# Patient Record
Sex: Female | Born: 1995 | Race: White | Hispanic: No | Marital: Single | Smoking: Never smoker
Health system: Southern US, Community
[De-identification: ages and names within clinical notes are randomized; demographics above are authoritative.]

## PROBLEM LIST (undated history)

## (undated) DIAGNOSIS — J45909 Unspecified asthma, uncomplicated: Secondary | ICD-10-CM

## (undated) DIAGNOSIS — E7800 Pure hypercholesterolemia, unspecified: Secondary | ICD-10-CM

## (undated) HISTORY — PX: WRIST SURGERY: SHX841

---

## 2016-01-14 ENCOUNTER — Inpatient Hospital Stay
Admission: EM | Admit: 2016-01-14 | Discharge: 2016-01-16 | DRG: 871 | Disposition: A | Discharge status: Home / Self Care | Payer: Managed Care, Other (non HMO) | Attending: Internal Medicine | Admitting: Internal Medicine

## 2016-01-14 ENCOUNTER — Encounter: Payer: Self-pay | Admitting: Emergency Medicine

## 2016-01-14 ENCOUNTER — Emergency Department: Payer: Managed Care, Other (non HMO)

## 2016-01-14 DIAGNOSIS — R000 Tachycardia, unspecified: Secondary | ICD-10-CM | POA: Diagnosis not present

## 2016-01-14 DIAGNOSIS — Z23 Encounter for immunization: Secondary | ICD-10-CM

## 2016-01-14 DIAGNOSIS — R509 Fever, unspecified: Secondary | ICD-10-CM

## 2016-01-14 DIAGNOSIS — A419 Sepsis, unspecified organism: Principal | ICD-10-CM | POA: Diagnosis present

## 2016-01-14 DIAGNOSIS — J189 Pneumonia, unspecified organism: Secondary | ICD-10-CM | POA: Diagnosis present

## 2016-01-14 DIAGNOSIS — J45909 Unspecified asthma, uncomplicated: Secondary | ICD-10-CM | POA: Diagnosis present

## 2016-01-14 DIAGNOSIS — Z79899 Other long term (current) drug therapy: Secondary | ICD-10-CM

## 2016-01-14 DIAGNOSIS — E7800 Pure hypercholesterolemia, unspecified: Secondary | ICD-10-CM | POA: Diagnosis present

## 2016-01-14 HISTORY — DX: Pure hypercholesterolemia, unspecified: E78.00

## 2016-01-14 HISTORY — DX: Unspecified asthma, uncomplicated: J45.909

## 2016-01-14 LAB — LACTIC ACID, PLASMA: Lactic Acid, Venous: 1.4 mmol/L (ref 0.5–1.9)

## 2016-01-14 LAB — CBC WITH DIFFERENTIAL/PLATELET
BASOS PCT: 0 %
Basophils Absolute: 0 10*3/uL (ref 0–0.1)
Eosinophils Absolute: 0 10*3/uL (ref 0–0.7)
Eosinophils Relative: 0 %
HEMATOCRIT: 41.5 % (ref 35.0–47.0)
Hemoglobin: 14 g/dL (ref 12.0–16.0)
Lymphocytes Relative: 7 %
Lymphs Abs: 0.9 10*3/uL — ABNORMAL LOW (ref 1.0–3.6)
MCH: 29 pg (ref 26.0–34.0)
MCHC: 33.6 g/dL (ref 32.0–36.0)
MCV: 86.4 fL (ref 80.0–100.0)
MONO ABS: 0.4 10*3/uL (ref 0.2–0.9)
MONOS PCT: 3 %
NEUTROS ABS: 11.3 10*3/uL — AB (ref 1.4–6.5)
Neutrophils Relative %: 90 %
Platelets: 174 10*3/uL (ref 150–440)
RBC: 4.81 MIL/uL (ref 3.80–5.20)
RDW: 13.3 % (ref 11.5–14.5)
WBC: 12.5 10*3/uL — ABNORMAL HIGH (ref 3.6–11.0)

## 2016-01-14 LAB — URINALYSIS COMPLETE WITH MICROSCOPIC (ARMC ONLY)
BILIRUBIN URINE: NEGATIVE
Bacteria, UA: NONE SEEN
Glucose, UA: NEGATIVE mg/dL
HGB URINE DIPSTICK: NEGATIVE
KETONES UR: NEGATIVE mg/dL
LEUKOCYTES UA: NEGATIVE
Nitrite: NEGATIVE
PH: 7 (ref 5.0–8.0)
Protein, ur: 100 mg/dL — AB
Specific Gravity, Urine: 1.025 (ref 1.005–1.030)

## 2016-01-14 LAB — COMPREHENSIVE METABOLIC PANEL
ALBUMIN: 4.2 g/dL (ref 3.5–5.0)
ALT: 18 U/L (ref 14–54)
ANION GAP: 9 (ref 5–15)
AST: 23 U/L (ref 15–41)
Alkaline Phosphatase: 50 U/L (ref 38–126)
BUN: 8 mg/dL (ref 6–20)
CO2: 23 mmol/L (ref 22–32)
Calcium: 9.1 mg/dL (ref 8.9–10.3)
Chloride: 102 mmol/L (ref 101–111)
Creatinine, Ser: 0.89 mg/dL (ref 0.44–1.00)
Glucose, Bld: 124 mg/dL — ABNORMAL HIGH (ref 65–99)
POTASSIUM: 3.7 mmol/L (ref 3.5–5.1)
Sodium: 134 mmol/L — ABNORMAL LOW (ref 135–145)
TOTAL PROTEIN: 8.5 g/dL — AB (ref 6.5–8.1)

## 2016-01-14 LAB — LIPASE, BLOOD: Lipase: 23 U/L (ref 11–51)

## 2016-01-14 LAB — POCT PREGNANCY, URINE: PREG TEST UR: NEGATIVE

## 2016-01-14 MED ORDER — SODIUM CHLORIDE 0.9 % IV BOLUS (SEPSIS)
1000.0000 mL | Freq: Once | INTRAVENOUS | Status: AC
  Administered 2016-01-14: 1000 mL via INTRAVENOUS

## 2016-01-14 MED ORDER — LEVOFLOXACIN IN D5W 750 MG/150ML IV SOLN
750.0000 mg | Freq: Once | INTRAVENOUS | Status: AC
  Administered 2016-01-14: 750 mg via INTRAVENOUS
  Filled 2016-01-14: qty 150

## 2016-01-14 MED ORDER — LEVOFLOXACIN 750 MG PO TABS: 750.0000 mg | ORAL_TABLET | Freq: Every day | ORAL | 0 refills | Status: DC

## 2016-01-14 NOTE — ED Notes (Signed)
Pt reports taking Amoxicillin 500mg  BID since 30 Oct for a sinus infection, pt reports HA,  feverish, sore throat, nasal drips, chills and sweats for the last 24 hours.  Reports 4 x 325mg  tylenol in the last 24 and multiple Excedrin extra strength.

## 2016-01-14 NOTE — Discharge Instructions (Signed)
Please seek medical attention for any high fevers, chest pain, shortness of breath, change in behavior, persistent vomiting, bloody stool or any other new or concerning symptoms.  

## 2016-01-14 NOTE — ED Triage Notes (Signed)
Patient with complaint of cough, congestion, fever and right upper abd pain that started last Thursday. Patient was seen at urgent care and started on antibiotics on Saturday. Patient reports that she stared feeling better. Then Friday morning started running a fever.

## 2016-01-14 NOTE — ED Provider Notes (Signed)
[facility] Emergency Department Provider Note    ____________________________________________   I have reviewed the triage vital signs and the nursing notes.   HISTORY  Chief Complaint Fever; Headache; Abdominal Pain; and Nasal Congestion   History limited by: Not Limited   HPI Robin Riddle is a 20 y.o. female who presents to the emergency department today because of concerns for headache, fever, cough and congestion. The patient states that she had symptoms of headache, congestion for roughly 1 week. She has a history of recurrent sinus infections so did Prescribe some penicillin. Yesterday the patient developed a fever. Temp max 104. The patient additionally started having pain in her right lower chest when she took deep breaths or cough. Her cough is productive of yellow phlegm.   Past Medical History:  Diagnosis Date  . Asthma   . Hypercholesterolemia     There are no active problems to display for this patient.   Past Surgical History:  Procedure Laterality Date  . WRIST SURGERY      Prior to Admission medications   Not on File    Allergies Review of patient's allergies indicates no known allergies.  No family history on file.  Social History Social History  Substance Use Topics  . Smoking status: Never Smoker  . Smokeless tobacco: Never Used  . Alcohol use Yes     Comment: occ    Review of Systems  Constitutional: Positive for fever. Cardiovascular: Positive for right lower chest pain. Respiratory: Negative for shortness of breath. Positive for cough. Gastrointestinal: Negative for abdominal pain, vomiting and diarrhea. Neurological: Negative for headaches, focal weakness or numbness.  10-point ROS otherwise negative.  ____________________________________________   PHYSICAL EXAM:  VITAL SIGNS: ED Triage Vitals  Enc Vitals Group     BP 01/14/16 1915 (!) 133/91     Pulse Rate 01/14/16 1915 (!) 146     Resp  01/14/16 1915 20     Temp 01/14/16 1915 (!) 103.2 F (39.6 C)     Temp Source 01/14/16 1915 Oral     SpO2 01/14/16 1915 96 %     Weight 01/14/16 1916 158 lb (71.7 kg)     Height 01/14/16 1916 5\' 9"  (1.753 m)     Head Circumference --      Peak Flow --      Pain Score 01/14/16 1925 7   Constitutional: Alert and oriented. Well appearing and in no distress. Eyes: Conjunctivae are normal. Normal extraocular movements. ENT   Head: Normocephalic and atraumatic.   Nose: No congestion/rhinnorhea.   Mouth/Throat: Mucous membranes are moist.   Neck: No stridor. Hematological/Lymphatic/Immunilogical: No cervical lymphadenopathy. Cardiovascular: Tachycardic, regular rhythm.  No murmurs, rubs, or gallops. Respiratory: Normal respiratory effort without tachypnea nor retractions. Breath sounds are clear and equal bilaterally. No wheezes/rales/rhonchi. Gastrointestinal: Soft and nontender. No Murphy's sign. No distention.  Genitourinary: Deferred Musculoskeletal: Normal range of motion in all extremities. No lower extremity edema. Neurologic:  Normal speech and language. No gross focal neurologic deficits are appreciated.  Skin:  Skin is warm, dry and intact. No rash noted. Psychiatric: Mood and affect are normal. Speech and behavior are normal. Patient exhibits appropriate insight and judgment.  ____________________________________________    LABS (pertinent positives/negatives)  Labs Reviewed  CBC WITH DIFFERENTIAL/PLATELET - Abnormal; Notable for the following:       Result Value   WBC 12.5 (*)    Neutro Abs 11.3 (*)    Lymphs Abs 0.9 (*)    All other  components within normal limits  COMPREHENSIVE METABOLIC PANEL - Abnormal; Notable for the following:    Sodium 134 (*)    Glucose, Bld 124 (*)    Total Protein 8.5 (*)    Total Bilirubin <0.1 (*)    All other components within normal limits  URINALYSIS COMPLETEWITH MICROSCOPIC ([facility] ONLY) - Abnormal; Notable for the  following:    Color, Urine YELLOW (*)    APPearance CLEAR (*)    Protein, ur 100 (*)    Squamous Epithelial / LPF 0-5 (*)    All other components within normal limits  CULTURE, BLOOD (ROUTINE X 2)  CULTURE, BLOOD (ROUTINE X 2)  URINE CULTURE  LACTIC ACID, PLASMA  LIPASE, BLOOD  LACTIC ACID, PLASMA  POC URINE PREG, ED  POCT PREGNANCY, URINE     ____________________________________________   EKG  None  ____________________________________________    RADIOLOGY  CXR IMPRESSION:  Left lower lobe pneumonia noted.      I, [name], personally viewed and evaluated these images (plain radiographs) as part of my medical decision making. ____________________________________________   PROCEDURES  Procedures  ____________________________________________   INITIAL IMPRESSION / ASSESSMENT AND PLAN / ED COURSE  Pertinent labs & imaging results that were available during my care of the patient were reviewed by me and considered in my medical decision making (see chart for details).  Chest x-ray concerning for pneumonia. Patient will be given IV fluids and IV antibiotics here. If patient's tachycardia resolves think she would be appropriate for outpatient treatment. Will prepare paperwork and prescription in that event. ____________________________________________   FINAL CLINICAL IMPRESSION(S) / ED DIAGNOSES  Pneumonia  Note: This dictation was prepared with Dragon dictation. Any transcriptional errors that result from this process are unintentional    [name], MD 01/14/16 2338

## 2016-01-15 DIAGNOSIS — Z79899 Other long term (current) drug therapy: Secondary | ICD-10-CM | POA: Diagnosis not present

## 2016-01-15 DIAGNOSIS — A419 Sepsis, unspecified organism: Secondary | ICD-10-CM | POA: Diagnosis present

## 2016-01-15 DIAGNOSIS — J45909 Unspecified asthma, uncomplicated: Secondary | ICD-10-CM | POA: Diagnosis present

## 2016-01-15 DIAGNOSIS — Z23 Encounter for immunization: Secondary | ICD-10-CM | POA: Diagnosis not present

## 2016-01-15 DIAGNOSIS — J189 Pneumonia, unspecified organism: Secondary | ICD-10-CM | POA: Diagnosis present

## 2016-01-15 DIAGNOSIS — E7800 Pure hypercholesterolemia, unspecified: Secondary | ICD-10-CM | POA: Diagnosis present

## 2016-01-15 DIAGNOSIS — R000 Tachycardia, unspecified: Secondary | ICD-10-CM | POA: Diagnosis present

## 2016-01-15 LAB — INFLUENZA PANEL BY PCR (TYPE A & B)
H1N1 flu by pcr: NOT DETECTED
Influenza A By PCR: NEGATIVE
Influenza B By PCR: NEGATIVE

## 2016-01-15 LAB — LACTIC ACID, PLASMA: Lactic Acid, Venous: 0.9 mmol/L (ref 0.5–1.9)

## 2016-01-15 LAB — TSH: TSH: 2.248 u[IU]/mL (ref 0.350–4.500)

## 2016-01-15 MED ORDER — LORATADINE 10 MG PO TABS
10.0000 mg | ORAL_TABLET | Freq: Every day | ORAL | Status: DC
  Administered 2016-01-15 – 2016-01-16 (×2): 10 mg via ORAL
  Filled 2016-01-15: qty 1

## 2016-01-15 MED ORDER — ACETAMINOPHEN 325 MG PO TABS
650.0000 mg | ORAL_TABLET | Freq: Four times a day (QID) | ORAL | Status: DC | PRN
  Administered 2016-01-15: 650 mg via ORAL
  Filled 2016-01-15: qty 2

## 2016-01-15 MED ORDER — INFLUENZA VAC SPLIT QUAD 0.5 ML IM SUSY
0.5000 mL | PREFILLED_SYRINGE | INTRAMUSCULAR | Status: AC
  Administered 2016-01-16: 0.5 mL via INTRAMUSCULAR
  Filled 2016-01-15: qty 0.5

## 2016-01-15 MED ORDER — DOCUSATE SODIUM 100 MG PO CAPS
100.0000 mg | ORAL_CAPSULE | Freq: Two times a day (BID) | ORAL | Status: DC
  Administered 2016-01-15 (×2): 100 mg via ORAL
  Filled 2016-01-15 (×3): qty 1

## 2016-01-15 MED ORDER — AMOXICILLIN-POT CLAVULANATE 875-125 MG PO TABS
1.0000 | ORAL_TABLET | Freq: Two times a day (BID) | ORAL | Status: DC
  Administered 2016-01-15: 1 via ORAL
  Filled 2016-01-15: qty 1

## 2016-01-15 MED ORDER — ONDANSETRON HCL 4 MG PO TABS: 4.0000 mg | ORAL_TABLET | Freq: Four times a day (QID) | ORAL | Status: DC | PRN

## 2016-01-15 MED ORDER — IBUPROFEN 400 MG PO TABS
400.0000 mg | ORAL_TABLET | Freq: Once | ORAL | Status: AC
  Administered 2016-01-15: 400 mg via ORAL
  Filled 2016-01-15: qty 1

## 2016-01-15 MED ORDER — SODIUM CHLORIDE 0.9% FLUSH: 3.0000 mL | Freq: Two times a day (BID) | INTRAVENOUS | Status: DC

## 2016-01-15 MED ORDER — IBUPROFEN 800 MG PO TABS
800.0000 mg | ORAL_TABLET | Freq: Once | ORAL | Status: AC
  Administered 2016-01-15: 800 mg via ORAL
  Filled 2016-01-15: qty 1

## 2016-01-15 MED ORDER — SODIUM CHLORIDE 0.9 % IV BOLUS (SEPSIS)
1000.0000 mL | Freq: Once | INTRAVENOUS | Status: AC
  Administered 2016-01-15: 1000 mL via INTRAVENOUS

## 2016-01-15 MED ORDER — BENZONATATE 100 MG PO CAPS
200.0000 mg | ORAL_CAPSULE | Freq: Three times a day (TID) | ORAL | Status: DC
  Administered 2016-01-15 (×3): 200 mg via ORAL
  Filled 2016-01-15 (×6): qty 2

## 2016-01-15 MED ORDER — ACETAMINOPHEN 650 MG RE SUPP: 650.0000 mg | Freq: Four times a day (QID) | RECTAL | Status: DC | PRN

## 2016-01-15 MED ORDER — ACETAMINOPHEN 500 MG PO TABS
1000.0000 mg | ORAL_TABLET | Freq: Once | ORAL | Status: AC
  Administered 2016-01-15: 1000 mg via ORAL
  Filled 2016-01-15: qty 2

## 2016-01-15 MED ORDER — ONDANSETRON HCL 4 MG/2ML IJ SOLN: 4.0000 mg | Freq: Four times a day (QID) | INTRAMUSCULAR | Status: DC | PRN

## 2016-01-15 MED ORDER — DROSPIRENONE-ETHINYL ESTRADIOL 3-0.02 MG PO TABS: 1.0000 | ORAL_TABLET | Freq: Every day | ORAL | Status: DC

## 2016-01-15 MED ORDER — LEVOFLOXACIN 750 MG PO TABS
750.0000 mg | ORAL_TABLET | Freq: Every day | ORAL | Status: DC
  Administered 2016-01-15: 750 mg via ORAL
  Filled 2016-01-15: qty 1

## 2016-01-15 MED ORDER — ENOXAPARIN SODIUM 40 MG/0.4ML ~~LOC~~ SOLN: 40.0000 mg | SUBCUTANEOUS | Status: DC

## 2016-01-15 MED ORDER — SODIUM CHLORIDE 0.9 % IV SOLN
INTRAVENOUS | Status: DC
  Administered 2016-01-15: 125 mL/h via INTRAVENOUS
  Administered 2016-01-15 – 2016-01-16 (×2): via INTRAVENOUS

## 2016-01-15 MED ORDER — IBUPROFEN 400 MG PO TABS
400.0000 mg | ORAL_TABLET | Freq: Four times a day (QID) | ORAL | Status: DC | PRN
  Administered 2016-01-16: 400 mg via ORAL
  Filled 2016-01-15: qty 1

## 2016-01-15 NOTE — H&P (Signed)
Robin Riddle is an 20 y.o. female.   Chief Complaint: Fever HPI: The patient presents emergency department complaining of fever. She had been diagnosed with pneumonia and been taking her antibiotics as directed for the last but continued to feel bad. The patient admits to intermittent abdominal pain, dizziness and occasional headache. She denies shortness of breath or chest pain. In the emergency department she was found to have leukocytosis as well as high fever which prompted the emergency department staff to call for admission.  Past Medical History:  Diagnosis Date  . Asthma   . Hypercholesterolemia     Past Surgical History:  Procedure Laterality Date  . WRIST SURGERY      Family History  Problem Relation Age of Onset  . Hyperlipidemia Father    Social History:  reports that she has never smoked. She has never used smokeless tobacco. She reports that she drinks alcohol. She reports that she does not use drugs.  Allergies: No Known Allergies  Medications Prior to Admission  Medication Sig Dispense Refill  . amoxicillin-clavulanate (AUGMENTIN) 875-125 MG tablet Take 1 tablet by mouth 2 (two) times daily.    . drospirenone-ethinyl estradiol (YAZ,GIANVI,LORYNA) 3-0.02 MG tablet Take 1 tablet by mouth daily.      Results for orders placed or performed during the hospital encounter of 01/14/16 (from the past 48 hour(s))  Lactic acid, plasma     Status: None   Collection Time: 01/14/16  7:49 PM  Result Value Ref Range   Lactic Acid, Venous 1.4 0.5 - 1.9 mmol/L  CBC with Differential/Platelet     Status: Abnormal   Collection Time: 01/14/16  8:06 PM  Result Value Ref Range   WBC 12.5 (H) 3.6 - 11.0 K/uL   RBC 4.81 3.80 - 5.20 MIL/uL   Hemoglobin 14.0 12.0 - 16.0 g/dL   HCT 41.5 35.0 - 47.0 %   MCV 86.4 80.0 - 100.0 fL   MCH 29.0 26.0 - 34.0 pg   MCHC 33.6 32.0 - 36.0 g/dL   RDW 13.3 11.5 - 14.5 %   Platelets 174 150 - 440 K/uL   Neutrophils Relative % 90 %   Neutro Abs  11.3 (H) 1.4 - 6.5 K/uL   Lymphocytes Relative 7 %   Lymphs Abs 0.9 (L) 1.0 - 3.6 K/uL   Monocytes Relative 3 %   Monocytes Absolute 0.4 0.2 - 0.9 K/uL   Eosinophils Relative 0 %   Eosinophils Absolute 0.0 0 - 0.7 K/uL   Basophils Relative 0 %   Basophils Absolute 0.0 0 - 0.1 K/uL  Comprehensive metabolic panel     Status: Abnormal   Collection Time: 01/14/16  8:06 PM  Result Value Ref Range   Sodium 134 (L) 135 - 145 mmol/L   Potassium 3.7 3.5 - 5.1 mmol/L   Chloride 102 101 - 111 mmol/L   CO2 23 22 - 32 mmol/L   Glucose, Bld 124 (H) 65 - 99 mg/dL   BUN 8 6 - 20 mg/dL   Creatinine, Ser 0.89 0.44 - 1.00 mg/dL   Calcium 9.1 8.9 - 10.3 mg/dL   Total Protein 8.5 (H) 6.5 - 8.1 g/dL   Albumin 4.2 3.5 - 5.0 g/dL   AST 23 15 - 41 U/L   ALT 18 14 - 54 U/L   Alkaline Phosphatase 50 38 - 126 U/L   Total Bilirubin <0.1 (L) 0.3 - 1.2 mg/dL   GFR calc non Af Amer >60 >60 mL/min   GFR calc Af  Amer >60 >60 mL/min    Comment: (NOTE) The eGFR has been calculated using the CKD EPI equation. This calculation has not been validated in all clinical situations. eGFR's persistently <60 mL/min signify possible Chronic Kidney Disease.    Anion gap 9 5 - 15  Lipase, blood     Status: None   Collection Time: 01/14/16  8:06 PM  Result Value Ref Range   Lipase 23 11 - 51 U/L  Pregnancy, urine POC     Status: None   Collection Time: 01/14/16  8:09 PM  Result Value Ref Range   Preg Test, Ur NEGATIVE NEGATIVE    Comment:        THE SENSITIVITY OF THIS METHODOLOGY IS >24 mIU/mL   Urinalysis complete, with microscopic ([facility] only)     Status: Abnormal   Collection Time: 01/14/16  8:12 PM  Result Value Ref Range   Color, Urine YELLOW (A) YELLOW   APPearance CLEAR (A) CLEAR   Glucose, UA NEGATIVE NEGATIVE mg/dL   Bilirubin Urine NEGATIVE NEGATIVE   Ketones, ur NEGATIVE NEGATIVE mg/dL   Specific Gravity, Urine 1.025 1.005 - 1.030   Hgb urine dipstick NEGATIVE NEGATIVE   pH 7.0 5.0 - 8.0    Protein, ur 100 (A) NEGATIVE mg/dL   Nitrite NEGATIVE NEGATIVE   Leukocytes, UA NEGATIVE NEGATIVE   RBC / HPF 0-5 0 - 5 RBC/hpf   WBC, UA 0-5 0 - 5 WBC/hpf   Bacteria, UA NONE SEEN NONE SEEN   Squamous Epithelial / LPF 0-5 (A) NONE SEEN   Mucous PRESENT   Lactic acid, plasma     Status: None   Collection Time: 01/15/16 12:57 AM  Result Value Ref Range   Lactic Acid, Venous 0.9 0.5 - 1.9 mmol/L   Dg Chest 2 View  Result Date: 01/14/2016 CLINICAL DATA:  Acute onset of fever, sore throat, chills and diaphoresis. Nasal drip. Headache and cough. Initial encounter. EXAM: CHEST  2 VIEW COMPARISON:  None. FINDINGS: The lungs are well-aerated. Left lower lobe airspace opacity is compatible with pneumonia. There is no evidence of pleural effusion or pneumothorax. The heart is normal in size; the mediastinal contour is within normal limits. No acute osseous abnormalities are seen. IMPRESSION: Left lower lobe pneumonia noted. Electronically Signed   By: Garald Balding M.D.   On: 01/14/2016 21:14    Review of Systems  Constitutional: Positive for fever. Negative for chills.  HENT: Negative for sore throat and tinnitus.   Eyes: Negative for blurred vision and redness.  Respiratory: Negative for cough and shortness of breath.   Cardiovascular: Negative for chest pain, palpitations, orthopnea and PND.  Gastrointestinal: Negative for abdominal pain, diarrhea, nausea and vomiting.  Genitourinary: Negative for dysuria, frequency and urgency.  Musculoskeletal: Negative for joint pain and myalgias.  Skin: Negative for rash.       No lesions  Neurological: Positive for dizziness. Negative for speech change, focal weakness and weakness.  Endo/Heme/Allergies: Does not bruise/bleed easily.       No temperature intolerance  Psychiatric/Behavioral: Negative for depression and suicidal ideas.    Blood pressure (!) 110/56, pulse 98, temperature 98.4 F (36.9 C), temperature source Oral, resp. rate 18, height  5' 9"  (1.753 m), weight 68.4 kg (150 lb 12.8 oz), last menstrual period 12/24/2015, SpO2 98 %. Physical Exam  Vitals reviewed. Constitutional: She is oriented to person, place, and time. She appears well-developed and well-nourished. No distress.  HENT:  Head: Normocephalic and atraumatic.  Mouth/Throat: Oropharynx is  clear and moist.  Eyes: Conjunctivae and EOM are normal. Pupils are equal, round, and reactive to light. No scleral icterus.  Neck: Normal range of motion. Neck supple. No JVD present. No tracheal deviation present. No thyromegaly present.  Cardiovascular: Normal rate, regular rhythm and normal heart sounds.  Exam reveals no gallop and no friction rub.   No murmur heard. Respiratory: Effort normal and breath sounds normal.  GI: Soft. Bowel sounds are normal. She exhibits no distension. There is no tenderness.  Genitourinary:  Genitourinary Comments: Deferred  Musculoskeletal: Normal range of motion. She exhibits no edema.  Lymphadenopathy:    She has no cervical adenopathy.  Neurological: She is alert and oriented to person, place, and time. No cranial nerve deficit. She exhibits normal muscle tone.  Skin: Skin is warm and dry. No rash noted. No erythema.  Psychiatric: She has a normal mood and affect. Her behavior is normal. Judgment and thought content normal.     Assessment/Plan This is a 20 year old female admitted for sepsis. 1. Sepsis: The patient meets criteria via leukocytosis and fever. I suspect this may be viral sepsis at this time as the patient has been on antibiotics for her known pneumonia. Check influenza 2. Pneumonia: Community-acquired. Continue Augmentin to complete at least 10 days of antibiotics. The patient oxygen saturations are normal on room air. 3. DVT prophylaxis: None as the patient may ambulate 4. GI prophylaxis: None The patient is a full code. Time spent on admission orders and patient care approximately 45 minutes  Harrie Foreman,  MD 01/15/2016, 5:02 AM

## 2016-01-15 NOTE — Progress Notes (Signed)
Shift assessment completed. Pt is alert and oriented, sounds congested, has infrequent cough. Pt is on room air, lungs are decreased to l base, pt denied pain, denied sob. Monitor in place, St noted. Abdomen si soft, bs heard. Ppp, no edema noted. PIV #20 intact to lac with iv ns infusing at 17925mls/hr, site is free of redness and swelling. Pt has call bell in reach.

## 2016-01-15 NOTE — ED Provider Notes (Signed)
-----------------------------------------   1:17 AM on 01/15/2016 -----------------------------------------  Oral temperature 101.56F. Heart rate 126. Patient states overall she is feeling much better. However, given her tachycardia which is most likely secondary to fever, will hold for a third liter of IV normal saline in addition to antipyretic and reassess.   Irean HongJade J Lenard Kampf, MD 01/15/16 [phone]

## 2016-01-15 NOTE — Progress Notes (Signed)
Pt's flu and H1N1 PCR results came back negative, droplet precaution isolation discontinued. Pt made aware about the result.

## 2016-01-15 NOTE — Progress Notes (Signed)
[facility] [place] [facility]   PATIENT NAME: Robin Riddle    MR#:  [id number]  DATE OF BIRTH:  [birth year]  SUBJECTIVE:  CHIEF COMPLAINT:   Chief Complaint  Patient presents with  . Fever  . Headache  . Abdominal Pain  . Nasal Congestion   - still feels congested, sore throat and post nasal drip - headache is better, has cough, no chest pain - last temp of 100.104F at 4AM  REVIEW OF SYSTEMS:  Review of Systems  Constitutional: Positive for fever. Negative for chills and malaise/fatigue.  HENT: Positive for congestion and sore throat. Negative for ear discharge, ear pain and tinnitus.   Eyes: Negative for blurred vision and double vision.  Respiratory: Positive for cough. Negative for shortness of breath and wheezing.   Cardiovascular: Negative for chest pain, palpitations and leg swelling.  Gastrointestinal: Negative for abdominal pain, constipation, diarrhea, nausea and vomiting.  Genitourinary: Negative for dysuria and urgency.  Musculoskeletal: Negative for back pain, myalgias and neck pain.  Neurological: Negative for dizziness, sensory change, speech change, focal weakness, seizures and headaches.  Psychiatric/Behavioral: Negative for depression.    DRUG ALLERGIES:  No Known Allergies  VITALS:  Blood pressure 108/61, pulse 94, temperature 97.7 F (36.5 C), temperature source Oral, resp. rate 18, height 5\' 9"  (1.753 m), weight 68.4 kg (150 lb 12.8 oz), last menstrual period 12/24/2015, SpO2 98 %.  PHYSICAL EXAMINATION:  Physical Exam  GENERAL:  20 y.o.-year-old patient lying in the bed with no acute distress.  EYES: Pupils equal, round, reactive to light and accommodation. No scleral icterus. Extraocular muscles intact.  HEENT: Head atraumatic, normocephalic. Oropharynx and nasopharynx clear. Erythema of the posterior pharyngeal wall seen NECK:  Supple, no jugular venous distention. No thyroid enlargement, no tenderness.  LUNGS: Normal  breath sounds bilaterally, no wheezing, rales,rhonchi or crepitation. No use of accessory muscles of respiration.  CARDIOVASCULAR: S1, S2 normal. No murmurs, rubs, or gallops.  ABDOMEN: Soft, nontender, nondistended. Bowel sounds present. No organomegaly or mass.  EXTREMITIES: No pedal edema, cyanosis, or clubbing.  NEUROLOGIC: Cranial nerves II through XII are intact. Muscle strength 5/5 in all extremities. Sensation intact. Gait not checked.  PSYCHIATRIC: The patient is alert and oriented x 3.  SKIN: No obvious rash, lesion, or ulcer.    LABORATORY PANEL:   CBC  Recent Labs Lab 01/14/16 2006  WBC 12.5*  HGB 14.0  HCT 41.5  PLT 174   ------------------------------------------------------------------------------------------------------------------  Chemistries   Recent Labs Lab 01/14/16 2006  NA 134*  K 3.7  CL 102  CO2 23  GLUCOSE 124*  BUN 8  CREATININE 0.89  CALCIUM 9.1  AST 23  ALT 18  ALKPHOS 50  BILITOT <0.1*   ------------------------------------------------------------------------------------------------------------------  Cardiac Enzymes No results for input(s): TROPONINI in the last 168 hours. ------------------------------------------------------------------------------------------------------------------  RADIOLOGY:  Dg Chest 2 View  Result Date: 01/14/2016 CLINICAL DATA:  Acute onset of fever, sore throat, chills and diaphoresis. Nasal drip. Headache and cough. Initial encounter. EXAM: CHEST  2 VIEW COMPARISON:  None. FINDINGS: The lungs are well-aerated. Left lower lobe airspace opacity is compatible with pneumonia. There is no evidence of pleural effusion or pneumothorax. The heart is normal in size; the mediastinal contour is within normal limits. No acute osseous abnormalities are seen. IMPRESSION: Left lower lobe pneumonia noted. Electronically Signed   By: Roanna RaiderJeffery  Chang M.D.   On: 01/14/2016 21:14    EKG:  No orders found for this or any  previous visit.  ASSESSMENT AND PLAN:   20 year old female with past medical history significant for asthma presents to hospital secondary to fevers and cough.  #1 sepsis-secondary to left lower lobe pneumonia  -influenza PCR is negative. -Continue to monitor for fevers. Follow blood cultures. -Continue Levaquin  #2 Pneumonia-dry cough, added Tessalon Perles. On antibiotics. -Added Claritin for increased postnasal drip  #3 asthma-stable.  #4 DVT prophylaxis-we'll start Lovenox daily especially since she is on birth control     All the records are reviewed and case discussed with Care Management/Social Workerr. Management plans discussed with the patient, family and they are in agreement.  CODE STATUS: Full code  TOTAL TIME TAKING CARE OF THIS PATIENT: 35 minutes.   POSSIBLE D/C TOMORROW, DEPENDING ON CLINICAL CONDITION.   Enid Baas M.D on 01/15/2016 at 11:24 AM  Between 7am to 6pm - Pager - [phone]  After 6pm go to [email or web address] - Social research officer, government  [facility]  Office  [phone]  CC: Primary care physician; No PCP Per Patient

## 2016-01-15 NOTE — Progress Notes (Signed)
Pharmacy Antibiotic Note  Robin Riddle is a 20 y.o. female admitted on 01/14/2016 with pneumonia.  Pharmacy has been consulted for Levofloxacin dosing.  Plan: Patient received 1 dose levofloxacin 750mg  IV in ED last night at 2100. Will continue Levofloxacin 750mg  Daily starting at 1800 today.   Height: 5\' 9"  (175.3 cm) Weight: 150 lb 12.8 oz (68.4 kg) IBW/kg (Calculated) : 66.2  Temp (24hrs), Avg:100.7 F (38.2 C), Min:97.7 F (36.5 C), Max:103.4 F (39.7 C)   Recent Labs Lab 01/14/16 1949 01/14/16 2006 01/15/16 0057  WBC  --  12.5*  --   CREATININE  --  0.89  --   LATICACIDVEN 1.4  --  0.9    Estimated Creatinine Clearance: 105.4 mL/min (by C-G formula based on SCr of 0.89 mg/dL).    No Known Allergies  Antimicrobials this admission: 10/1 Augmentin >> 10/8 10/7 Levofloxacin >>   Dose adjustments this admission:  Microbiology results: BCx:  UCx:  Sputum:   MRSA PCR:   Thank you for allowing pharmacy to be a part of this patient's care.  Jasim Harari M Senta Kantor 01/15/2016 11:10 AM

## 2016-01-15 NOTE — Progress Notes (Signed)
Pt received tylenol po at this time for temperature of 101.4. Mother is now at bedside. Pt states she feels cold.

## 2016-01-15 NOTE — ED Notes (Signed)
Introduced self to pt. Pt denies needs at this time.

## 2016-01-15 NOTE — Progress Notes (Signed)
New Admission Note:   Arrival Method: per stretcher from ED, pt came from out of town attending [employer]'s homecoming Mental Orientation: alert and oriented X4 Telemetry: placed on box 4031, CCMD notified, verified with Lowella BandyNikki, RN Assessment: Completed Skin: warm, dry, intact, no wounds noted, within defined limits [facility], intact Pain: 5/10 on the throat when coughing, pt stated she received pain medicine in the ED.  Safety Measures: Safety Fall Prevention Plan has been given and discussed Admission: Completed 1A Orientation: Patient has been oriented to the room, unit and staff.  Family: no family member at the bedside as of this time  Orders have been reviewed and implemented. Will continue to monitor the patient. Call light has been placed within reach.  Janice NorrieAnessa Nieve Rojero BSN, RN [facility] 1A

## 2016-01-16 LAB — BASIC METABOLIC PANEL
ANION GAP: 6 (ref 5–15)
CO2: 22 mmol/L (ref 22–32)
Calcium: 8.2 mg/dL — ABNORMAL LOW (ref 8.9–10.3)
Chloride: 110 mmol/L (ref 101–111)
Creatinine, Ser: 0.59 mg/dL (ref 0.44–1.00)
GFR calc Af Amer: >60 mL/min (ref >60)
Glucose, Bld: 103 mg/dL — ABNORMAL HIGH (ref 65–99)
POTASSIUM: 3.5 mmol/L (ref 3.5–5.1)
SODIUM: 138 mmol/L (ref 135–145)

## 2016-01-16 LAB — CBC
HCT: 35.2 % (ref 35.0–47.0)
Hemoglobin: 12.1 g/dL (ref 12.0–16.0)
MCH: 29.3 pg (ref 26.0–34.0)
MCHC: 34.4 g/dL (ref 32.0–36.0)
MCV: 85.1 fL (ref 80.0–100.0)
PLATELETS: 136 10*3/uL — AB (ref 150–440)
RBC: 4.13 MIL/uL (ref 3.80–5.20)
RDW: 13.2 % (ref 11.5–14.5)
WBC: 6.9 10*3/uL (ref 3.6–11.0)

## 2016-01-16 LAB — URINE CULTURE: CULTURE: NO GROWTH

## 2016-01-16 LAB — HEMOGLOBIN A1C
Hgb A1c MFr Bld: 5.4 % (ref 4.8–5.6)
Mean Plasma Glucose: 108 mg/dL

## 2016-01-16 MED ORDER — LEVOFLOXACIN 750 MG PO TABS: 750.0000 mg | ORAL_TABLET | Freq: Every day | ORAL | 0 refills | Status: AC

## 2016-01-16 MED ORDER — BENZONATATE 200 MG PO CAPS: 200.0000 mg | ORAL_CAPSULE | Freq: Three times a day (TID) | ORAL | 0 refills | Status: AC

## 2016-01-16 MED ORDER — LORATADINE 10 MG PO TABS: 10.0000 mg | ORAL_TABLET | Freq: Every day | ORAL | 0 refills | Status: AC | PRN

## 2016-01-16 NOTE — Progress Notes (Signed)
[employer] - [place] at [facility]   PATIENT NAME: Robin Riddle    MR#:  [id number]  DATE OF BIRTH:  1996/02/07  SUBJECTIVE:  CHIEF COMPLAINT:   Chief Complaint  Patient presents with  . Fever  . Headache  . Abdominal Pain  . Nasal Congestion   - still spiking fevers, clinically improved though - last temp of 102 early this morning. Mother at bedside - no cough today- eager to go home as clinically much better  REVIEW OF SYSTEMS:  Review of Systems  Constitutional: Positive for fever. Negative for chills and malaise/fatigue.  HENT: Positive for congestion. Negative for ear discharge, ear pain, sore throat and tinnitus.   Eyes: Negative for blurred vision and double vision.  Respiratory: Negative for cough, shortness of breath and wheezing.   Cardiovascular: Negative for chest pain, palpitations and leg swelling.  Gastrointestinal: Negative for abdominal pain, constipation, diarrhea, nausea and vomiting.  Genitourinary: Negative for dysuria and urgency.  Musculoskeletal: Negative for back pain, myalgias and neck pain.  Neurological: Negative for dizziness, sensory change, speech change, focal weakness, seizures and headaches.  Psychiatric/Behavioral: Negative for depression.    DRUG ALLERGIES:  No Known Allergies  VITALS:  Blood pressure 116/65, pulse (!) 107, temperature 99.4 F (37.4 C), temperature source Oral, resp. rate 18, height 5\' 9"  (1.753 m), weight 73.2 kg (161 lb 4.8 oz), last menstrual period 12/24/2015, SpO2 95 %.  PHYSICAL EXAMINATION:  Physical Exam  GENERAL:  20 y.o.-year-old patient lying in the bed with no acute distress.  EYES: Pupils equal, round, reactive to light and accommodation. No scleral icterus. Extraocular muscles intact.  HEENT: Head atraumatic, normocephalic. Oropharynx and nasopharynx clear. Erythema of the posterior pharyngeal wall seen NECK:  Supple, no jugular venous distention. No thyroid enlargement, no  tenderness.  LUNGS: Normal breath sounds bilaterally, fine left basilar rales. no wheezing, rhonchi or crepitation. No use of accessory muscles of respiration.  CARDIOVASCULAR: S1, S2 normal. No murmurs, rubs, or gallops.  ABDOMEN: Soft, nontender, nondistended. Bowel sounds present. No organomegaly or mass.  EXTREMITIES: No pedal edema, cyanosis, or clubbing.  NEUROLOGIC: Cranial nerves II through XII are intact. Muscle strength 5/5 in all extremities. Sensation intact. Gait not checked.  PSYCHIATRIC: The patient is alert and oriented x 3.  SKIN: No obvious rash, lesion, or ulcer.    LABORATORY PANEL:   CBC  Recent Labs Lab 01/16/16 0343  WBC 6.9  HGB 12.1  HCT 35.2  PLT 136*   ------------------------------------------------------------------------------------------------------------------  Chemistries   Recent Labs Lab 01/14/16 2006 01/16/16 0343  NA 134* 138  K 3.7 3.5  CL 102 110  CO2 23 22  GLUCOSE 124* 103*  BUN 8 <5*  CREATININE 0.89 0.59  CALCIUM 9.1 8.2*  AST 23  --   ALT 18  --   ALKPHOS 50  --   BILITOT <0.1*  --    ------------------------------------------------------------------------------------------------------------------  Cardiac Enzymes No results for input(s): TROPONINI in the last 168 hours. ------------------------------------------------------------------------------------------------------------------  RADIOLOGY:  Dg Chest 2 View  Result Date: 01/14/2016 CLINICAL DATA:  Acute onset of fever, sore throat, chills and diaphoresis. Nasal drip. Headache and cough. Initial encounter. EXAM: CHEST  2 VIEW COMPARISON:  None. FINDINGS: The lungs are well-aerated. Left lower lobe airspace opacity is compatible with pneumonia. There is no evidence of pleural effusion or pneumothorax. The heart is normal in size; the mediastinal contour is within normal limits. No acute osseous abnormalities are seen. IMPRESSION: Left lower lobe pneumonia noted.  Electronically Signed   By: Roanna RaiderJeffery  Chang M.D.   On: 01/14/2016 21:14    EKG:  No orders found for this or any previous visit.  ASSESSMENT AND PLAN:   20 year old female with past medical history significant for asthma presents to hospital secondary to fevers and cough.  #1 sepsis-secondary to left lower lobe pneumonia  -influenza PCR is negative. -fevers last night again- but spacing out. Negative blood cultures. -Continue Levaquin  #2 Pneumonia-dry cough, added Tessalon Perles with improvement of cough. On antibiotics. -Added Claritin for increased postnasal drip  #3 asthma-stable.  #4 DVT prophylaxis- started on Lovenox daily especially since she is on birth control   Patient feels much better clinically, very anxious to go home today. Monitor fever curve, if doesn't spike fevers- will discharge today   All the records are reviewed and case discussed with Care Management/Social Workerr. Management plans discussed with the patient, family and they are in agreement.  CODE STATUS: Full code  TOTAL TIME TAKING CARE OF THIS PATIENT: 37 minutes.   POSSIBLE D/C TODAY OR TOMORROW, DEPENDING ON CLINICAL CONDITION.   Carrel Leather M.D on 01/16/2016 at 1:57 PM  Between 7am to 6pm - Pager - [phone]  After 6pm go to [email or web address] - Social research officer, governmentpassword EPAS [email or web address]  [facility] [place] [facility]  Office  [phone]  CC: Primary care physician; No PCP Per Patient

## 2016-01-16 NOTE — Progress Notes (Signed)
  January 16, 2016  Patient: Robin Riddle  Date of Birth: Oct 02, 1995  Date of Visit: 01/14/2016    To Whom It May Concern:  Robin Riddle was seen and treated in our emergency department or urgent care center on 01/14/2016 01/16/2016. Robin Riddle  may return to school on 01/23/2016.  Sincerely,

## 2016-01-18 NOTE — Discharge Summary (Signed)
[employer] - [place] at [facility]   PATIENT NAME: Robin Riddle    MR#:  [id number]  DATE OF BIRTH:  1995-10-17  DATE OF ADMISSION:  01/14/2016   ADMITTING PHYSICIAN: Arnaldo NatalMichael S Diamond, MD  DATE OF DISCHARGE: 01/16/2016  6:05 PM  PRIMARY CARE PHYSICIAN: No PCP Per Patient   ADMISSION DIAGNOSIS:   Tachycardia [R00.0] Fever, unspecified fever cause [R50.9] Community acquired pneumonia, unspecified laterality [J18.9]  DISCHARGE DIAGNOSIS:   Active Problems:   Sepsis (HCC)   SECONDARY DIAGNOSIS:   Past Medical History:  Diagnosis Date  . Asthma   . Hypercholesterolemia     HOSPITAL COURSE:   20 year old female with past medical history significant for asthma presents to hospital secondary to fevers and cough.  #1 sepsis-secondary to left lower lobe pneumonia  -influenza PCR is negative. -Fevers spacing out. Negative blood cultures. -Continue Levaquin at discharge  #2 Pneumonia-dry cough, added Tessalon Perles with improvement of cough. On antibiotics. -Added Claritin for increased postnasal drip prn  #3 asthma-stable.  Stable for discharge as long as fevers are improving.  DISCHARGE CONDITIONS:   Stable  CONSULTS OBTAINED:   None  DRUG ALLERGIES:   No Known Allergies DISCHARGE MEDICATIONS:     Medication List    STOP taking these medications   amoxicillin-clavulanate 875-125 MG tablet Commonly known as:  AUGMENTIN     TAKE these medications   benzonatate 200 MG capsule Commonly known as:  TESSALON Take 1 capsule (200 mg total) by mouth 3 (three) times daily. X 6 days and stop   drospirenone-ethinyl estradiol 3-0.02 MG tablet Commonly known as:  YAZ,GIANVI,LORYNA Take 1 tablet by mouth daily.   levofloxacin 750 MG tablet Commonly known as:  LEVAQUIN Take 1 tablet (750 mg total) by mouth daily. X 7 more days   loratadine 10 MG tablet Commonly known as:  CLARITIN Take 1 tablet (10 mg total) by mouth daily as  needed for allergies or rhinitis (congestion).        DISCHARGE INSTRUCTIONS:   1. PCP f/u in 1-2 weeks  DIET:   Regular diet  ACTIVITY:   Activity as tolerated  OXYGEN:   Home Oxygen: No.  Oxygen Delivery: room air  DISCHARGE LOCATION:   home   If you experience worsening of your admission symptoms, develop shortness of breath, life threatening emergency, suicidal or homicidal thoughts you must seek medical attention immediately by calling 911 or calling your MD immediately  if symptoms less severe.  You Must read complete instructions/literature along with all the possible adverse reactions/side effects for all the Medicines you take and that have been prescribed to you. Take any new Medicines after you have completely understood and accpet all the possible adverse reactions/side effects.   Please note  You were cared for by a hospitalist during your hospital stay. If you have any questions about your discharge medications or the care you received while you were in the hospital after you are discharged, you can call the unit and asked to speak with the hospitalist on call if the hospitalist that took care of you is not available. Once you are discharged, your primary care physician will handle any further medical issues. Please note that NO REFILLS for any discharge medications will be authorized once you are discharged, as it is imperative that you return to your primary care physician (or establish a relationship with a primary care physician if you do not have one) for your aftercare needs so that they can  reassess your need for medications and monitor your lab values.    On the day of Discharge:  VITAL SIGNS:   Blood pressure 113/60, pulse (!) 103, temperature 98.5 F (36.9 C), temperature source Oral, resp. rate 16, height 5\' 9"  (1.753 m), weight 73.2 kg (161 lb 4.8 oz), last menstrual period 12/24/2015, SpO2 100 %.  PHYSICAL EXAMINATION:    GENERAL:  20  y.o.-year-old patient lying in the bed with no acute distress.  EYES: Pupils equal, round, reactive to light and accommodation. No scleral icterus. Extraocular muscles intact.  HEENT: Head atraumatic, normocephalic. Oropharynx and nasopharynx clear. Erythema of the posterior pharyngeal wall seen NECK:  Supple, no jugular venous distention. No thyroid enlargement, no tenderness.  LUNGS: Normal breath sounds bilaterally, fine left basilar rales. no wheezing, rhonchi or crepitation. No use of accessory muscles of respiration.  CARDIOVASCULAR: S1, S2 normal. No murmurs, rubs, or gallops.  ABDOMEN: Soft, nontender, nondistended. Bowel sounds present. No organomegaly or mass.  EXTREMITIES: No pedal edema, cyanosis, or clubbing.  NEUROLOGIC: Cranial nerves II through XII are intact. Muscle strength 5/5 in all extremities. Sensation intact. Gait not checked.  PSYCHIATRIC: The patient is alert and oriented x 3.  SKIN: No obvious rash, lesion, or ulcer.    DATA REVIEW:   CBC  Recent Labs Lab 01/16/16 0343  WBC 6.9  HGB 12.1  HCT 35.2  PLT 136*    Chemistries   Recent Labs Lab 01/14/16 2006 01/16/16 0343  NA 134* 138  K 3.7 3.5  CL 102 110  CO2 23 22  GLUCOSE 124* 103*  BUN 8 <5*  CREATININE 0.89 0.59  CALCIUM 9.1 8.2*  AST 23  --   ALT 18  --   ALKPHOS 50  --   BILITOT <0.1*  --      Microbiology Results  Results for orders placed or performed during the hospital encounter of 01/14/16  Urine culture     Status: None   Collection Time: 01/14/16  8:12 PM  Result Value Ref Range Status   Specimen Description URINE, RANDOM  Final   Special Requests NONE  Final   Culture NO GROWTH Performed at [facility]   Final   Report Status 01/16/2016 FINAL  Final  Blood Culture (routine x 2)     Status: None (Preliminary result)   Collection Time: 01/14/16  8:24 PM  Result Value Ref Range Status   Specimen Description BLOOD LEFT ASSIST CONTROL  Final   Special Requests  BOTTLES DRAWN AEROBIC AND ANAEROBIC 10CC  Final   Culture NO GROWTH 4 DAYS  Final   Report Status PENDING  Incomplete  Blood Culture (routine x 2)     Status: None (Preliminary result)   Collection Time: 01/14/16  8:34 PM  Result Value Ref Range Status   Specimen Description BLOOD RIGHT ASSIST CONTROL  Final   Special Requests BOTTLES DRAWN AEROBIC AND ANAEROBIC 5CC  Final   Culture NO GROWTH 4 DAYS  Final   Report Status PENDING  Incomplete    RADIOLOGY:  No results found.   Management plans discussed with the patient, family and they are in agreement.  CODE STATUS:  Code Status History    Date Active Date Inactive Code Status Order ID Comments User Context   01/15/2016  4:20 AM 01/16/2016  9:10 PM Full Code [id number]  Arnaldo Natal, MD Inpatient      TOTAL TIME TAKING CARE OF THIS PATIENT: 37 minutes.    Leandrew [email or web address]  on 01/18/2016 at 2:53 PM  Between 7am to 6pm - Pager - [phone]  After 6pm go to [email or web address] - Scientist, research (life sciences) [place] [facility]  Office  [phone]  CC: Primary care physician; No PCP Per Patient   Note: This dictation was prepared with Dragon dictation along with smaller phrase technology. Any transcriptional errors that result from this process are unintentional.

## 2016-01-19 LAB — CULTURE, BLOOD (ROUTINE X 2)
CULTURE: NO GROWTH
Culture: NO GROWTH

## 2016-02-22 ENCOUNTER — Ambulatory Visit
Admission: RE | Admit: 2016-02-22 | Discharge: 2016-02-22 | Disposition: A | Discharge status: Home / Self Care | Payer: Managed Care, Other (non HMO) | Source: Ambulatory Visit | Attending: Otolaryngology | Admitting: Otolaryngology

## 2016-02-22 ENCOUNTER — Other Ambulatory Visit: Payer: Self-pay | Admitting: Otolaryngology

## 2016-02-22 DIAGNOSIS — R05 Cough: Secondary | ICD-10-CM

## 2016-02-22 DIAGNOSIS — J189 Pneumonia, unspecified organism: Secondary | ICD-10-CM | POA: Insufficient documentation

## 2016-02-22 DIAGNOSIS — R059 Cough, unspecified: Secondary | ICD-10-CM

## 2016-02-24 ENCOUNTER — Telehealth: Payer: Self-pay

## 2016-02-24 NOTE — Telephone Encounter (Signed)
lmov to schedule new patient appt per referral .

## 2016-02-28 NOTE — Telephone Encounter (Signed)
Patient unable to obtain an appt that fit into holiday availability/ class schedule.  She will attempt to schedule an appt while at home for break.  Patient does not with to schedule at this time.  Will call back if needing to be seen in the future.

## 2016-03-08 ENCOUNTER — Ambulatory Visit
Admission: RE | Admit: 2016-03-08 | Discharge: 2016-03-08 | Disposition: A | Discharge status: Home / Self Care | Payer: Managed Care, Other (non HMO) | Source: Ambulatory Visit | Attending: Otolaryngology | Admitting: Otolaryngology

## 2016-03-08 ENCOUNTER — Other Ambulatory Visit: Payer: Self-pay | Admitting: Otolaryngology

## 2016-03-08 DIAGNOSIS — R059 Cough, unspecified: Secondary | ICD-10-CM

## 2016-03-08 DIAGNOSIS — R05 Cough: Secondary | ICD-10-CM

## 2017-06-11 ENCOUNTER — Encounter: Payer: Self-pay | Admitting: Physical Therapy

## 2017-06-11 ENCOUNTER — Ambulatory Visit: Payer: Managed Care, Other (non HMO) | Attending: Family | Admitting: Physical Therapy

## 2017-06-11 DIAGNOSIS — M25511 Pain in right shoulder: Secondary | ICD-10-CM | POA: Insufficient documentation

## 2017-06-11 NOTE — Therapy (Addendum)
[facility] [address], [place], [address] Phone: [phone]   Fax:  [phone]  Physical Therapy Evaluation  Patient Details  Name: Robin Riddle MRN: [id number] Date of Birth: 1995-06-10 Referring Provider: Janeece Agee, PA   Encounter Date: 06/11/2017  PT End of Session - 06/11/17 0915    Visit Number  1    Number of Visits  17    Date for PT Re-Evaluation  08/06/17    PT Start Time  0800    PT Stop Time  0857    PT Time Calculation (min)  57 min    Activity Tolerance  Patient tolerated treatment well    Behavior During Therapy  [facility] for tasks assessed/performed       Past Medical History:  Diagnosis Date  . Asthma   . Hypercholesterolemia     Past Surgical History:  Procedure Laterality Date  . WRIST SURGERY      There were no vitals filed for this visit.  Subjective Assessment - 06/11/17 0807    Subjective  R shoulder pain    Pertinent History  Patient is a 22 year old female with hx of R shoulder pain over the past month, and reports she has "always" been unable to do bend over fly exercise d/t ant shoulder pain that radiates down the lateral aspect of the R arm. Patient reports pain with "pushing" motion and rotating motion of the shoulder. Worst pain in the past week 4/10 and best 0/10. No pertinent medical hx. Patient reports she is preparing for a [employer] race and is running 4x/week, cardio kickboxing 3-4x/week, and does 3-4 days of full body resitance training.    Limitations  Lifting;House hold activities    How long can you sit comfortably?  unlimited    How long can you stand comfortably?  unlimited    How long can you walk comfortably?  unlimited    Diagnostic tests  no imaging completed at this time    Patient Stated Goals  Be able to exercise without pain/ [employer] race first week in April    Pain Score  0-No pain    Pain Location  Shoulder    Pain Orientation  Right    Pain  Descriptors / Indicators  Aching    Pain Radiating Towards  lateral aspect of R shoulder    Pain Onset  1 to 4 weeks ago    Pain Frequency  Intermittent    Aggravating Factors   throwing, pushing, carrying trash, vacuuming     Pain Relieving Factors  icing post-exercise, tylenol    Effect of Pain on Daily Activities  unable to go through exercise routine w/o pain    Multiple Pain Sites  No         [facility] PT Assessment - 06/11/17 0001      Assessment   Medical Diagnosis  R shoulder impingement    Referring Provider  Janeece Agee, PA    Onset Date/Surgical Date  05/19/17    Hand Dominance  Right    Next MD Visit  unkown    Prior Therapy  Yes; successful      Balance Screen   Has the patient fallen in the past 6 months  No    Has the patient had a decrease in activity level because of a fear of falling?   No    Is the patient reluctant to leave their home because of a fear of  falling?   No      Home Environment   Living Environment  -- 1 story, no steps to enter, no problem navigating home      Prior Function   Level of Independence  Independent    Chartered certified accountant    Vocation Requirements  Sitting computer    Leisure  [employer] race prep      Sensation   Light Touch  -- intact        AROM All cervical motions wnl All shoulder motions wnl, with slight pain with IR   PROM All cervical and shoulder motion wnl w/o pain  Strength L shoulder/elbow strength 5/5 with all of the following R Shoulder strength 4+/5 no pain R Shoulder flex R- 4/5 R Shoulder ext R- 4+/5 R Shoulder abd R- 4/5 min pain R Shoulder IR R- 4+/5 R Shoulder ER R- 4/5 w/ pain R Elbow flexion 4+/5 bilat R Elbow ext 4+5/ bilat w/ slight pain following in R triceps prox tendon insertion Grip Strength: L- R- Rhomboid 4+/5 bilat Middle Trap 4/5 bilat Lower Trap 4/5 bilat Supine deep cervical flexor chin tuck + lift can hold for 27secs   Special Tests/Other UE Reflexes (-) Spurlings (-) Cervical  distraction in sitting (-) Leanord Asal (-) Neers (-) Painful Arc (+) Empty can  R only (+) ER lag sign R only (+) Lift off test (-) Apprehension (-) Relocation (-) Speeds with pain following release of resistance (+) O'Briens Bering Sorenson test 31 sec 22 push-ups before losing proper form in 31 sec  Palpation: No tenderness to palpation, patient reports when she has pain it is at the proximal tricep and bicep tendon during palpation  Posture: Patient has very slight rounded shoulder/forward head posture she is able to easily self correct and maintain. Patient has good scapulo-humeral rhythm with overhead motion and scapular retraction test  Patient's Current Regimen (completes total body resistance training 4x/week utilizing 2-3 of these exercises along with 2-3 lower body exercises) -Push up 3x 10 -Bench press 3x 10 80# -Tricep ext 3x 10 standing 70# bilat -Bicep curls 3x 10 20# each -Shrug 3x 10 115# bilat -Rows seated 3x10 75# bent over 3x 10 75# w/ pain discontinued  Ther-Ex -YTI exercise BW 1x 10 each position w/ instruction to try exercise in the gym with 2.5# plates (added to HEP) -Red tband standing IR/ER 1x 10 (added to HEP) -Education on the importance of rest days (alternating upper and lower body workouts, especially with running and cardio kickboxing 4x/week), and rep/set range for strength gains 3-5sets 8-12 reps                        PT Education - 06/11/17 0912    Education provided  Yes    Education Details  Patient was educated on diagnosis, anatomy and pathology involved, prognosis, role of PT, and was given an HEP, demonstrating exercise with proper form following verbal and tactile cues, and was given a paper hand out to continue exercise at home. Pt was educated on and agreed to plan of care.    Person(s) Educated  Patient    Methods  Explanation;Demonstration;Tactile cues;Verbal cues;Handout    Comprehension  Verbalized  understanding;Tactile cues required;Returned demonstration;Verbal cues required       PT Short Term Goals - 06/11/17 0954      PT SHORT TERM GOAL #1   Title  Pt will be independent with HEP in order to improve strength and  balance in order to decrease fall risk and improve function at home and work.    Time  2    Period  Weeks    Status  New        PT Long Term Goals - 06/11/17 0941      PT LONG TERM GOAL #1   Title  Pt will decrease quick DASH score by at least 8% in order to demonstrate clinically significant reduction in disability.    Baseline  to be taken at next visit    Time  8    Period  Weeks      PT LONG TERM GOAL #2   Title  Patient will demonstrate symmetrical, pain free gross 4+/5 shoulder/scapular strength in order to safely be able to climb rope at [place] race event    Baseline  3/5: See eval    Time  5    Period  Weeks      PT LONG TERM GOAL #3   Title  Patient will increase [employer] time by 23.5sec to demonstrate clinically significant improvement in back extensor strength ([place]ill 1999)    Baseline  3/5 31 sec    Time  5    Period  Weeks    Status  New      PT LONG TERM GOAL #4   Title  Pt will decrease worst pain as reported on NPRS by at least 3 points in order to demonstrate clinically significant reduction in pain.    Baseline  3/5 Worst: 4/10    Time  5    Period  Weeks    Status  New      PT LONG TERM GOAL #5   Title  Patient will increase FOTO score to 81 in order to demonstrate predicted increase in functional ability     Baseline  3/5: 65    Time  8    Period  Weeks    Status  New            Plan - 06/11/17 0933    Clinical Impression Statement  Pt is an 22 year old female c/o R shoulder pain following increased activity for [employer] race training (cardio kickboxing 4x/week, running 4x/week, and full body resistance training 4x/week). Current impairments and activity limitations in shoulder rotation motions such as throwing,  UE pushing motions such as opening a door, pain w/ heavy UE liting, and decreased strength RTC/prime movers. Patient is unable to participate in full exercise routine for [employer] race training, to ensure she has the strength and endurance to complete race successfully and safely. Pt will benefit from skilled PT intervention to address the aforementioned impairments and activity limitations for best return to PLOF and ensure safe return to exercise regimen.    History and Personal Factors relevant to plan of care:  2 personal factors/comorbidities, 3 body systems/activity limitations/participation restrictions     Clinical Presentation  Evolving    Clinical Presentation due to:  Patient completing excess of UE exercise without adequate rest and patient reporting not wanting to take "days off", asthma,, objective tests and measures, rotator cuff weakness and pain    Clinical Decision Making  Moderate    Rehab Potential  Good    Clinical Impairments Affecting Rehab Potential  (+) young age, active lifestyle, lack of other comorbidities, motivation (-) asthma, excessive exercise (possibly psycho-social)    PT Frequency  2x / week    PT Duration  8 weeks    PT Treatment/Interventions  Cryotherapy;Lobbyist  Stimulation;Moist Heat;Therapeutic exercise;Neuromuscular re-education;Taping;Manual techniques;Dry needling    PT Next Visit Plan  HEP review; lat pull down exercise    PT Home Exercise Plan  YTI w/ 2.5#, red tband IR/ER     Consulted and Agree with Plan of Care  Patient       Patient will benefit from skilled therapeutic intervention in order to improve the following deficits and impairments:  Decreased endurance, Decreased strength, Impaired UE functional use  Visit Diagnosis: Acute pain of right shoulder - Plan: PT plan of care cert/re-cert     Problem List Patient Active Problem List   Diagnosis Date Noted  . Sepsis (HCC) 01/15/2016   Staci Acostahelsea Miller PT, DPT Staci Acostahelsea Miller 06/11/2017,  10:03 AM  [facility] [address] [place], [place], [address] Phone: [phone]   Fax:  [phone]  Name: Robin Riddle MRN: [id number] Date of Birth: 10-31-1995

## 2017-06-12 ENCOUNTER — Encounter: Payer: Self-pay | Admitting: Physical Therapy

## 2017-06-12 ENCOUNTER — Ambulatory Visit: Payer: Managed Care, Other (non HMO) | Admitting: Physical Therapy

## 2017-06-12 DIAGNOSIS — M25511 Pain in right shoulder: Secondary | ICD-10-CM

## 2017-06-12 NOTE — Therapy (Signed)
[facility] PHYSICAL AND SPORTS MEDICINE [address] [place], [address] Phone: [phone]   Fax:  [phone]  Physical Therapy Treatment  Patient Details  Name: Robin Riddle MRN: [id number] Date of Birth: May 29, 1995 Referring Provider: Janeece Agee, PA   Encounter Date: 06/12/2017  PT End of Session - 06/12/17 1622    Visit Number  2    Number of Visits  17    Date for PT Re-Evaluation  08/06/17    PT Start Time  0400    PT Stop Time  0440    PT Time Calculation (min)  40 min    Activity Tolerance  Patient tolerated treatment well    Behavior During Therapy  [facility] for tasks assessed/performed       Past Medical History:  Diagnosis Date  . Asthma   . Hypercholesterolemia     Past Surgical History:  Procedure Laterality Date  . WRIST SURGERY      There were no vitals filed for this visit.  Subjective Assessment - 06/12/17 1605    Subjective  Patient reports she had 6/10 pain this morning with resisted ER, but that it is a 0/10 now. Patient reports compliance with her HEP and no pain with other exercises.     Pertinent History  Patient is a 22 year old female with hx of R shoulder pain over the past month, and reports she has "always" been unable to do bend over fly exercise d/t ant shoulder pain that radiates down the lateral aspect of the R arm. Patient reports pain with "pushing" motion and rotating motion of the shoulder. Worst pain in the past week 4/10 and best 0/10. No pertinent medical hx. Patient reports she is preparing for a [employer] race and is running 4x/week, cardio kickboxing 3-4x/week, and does 3-4 days of full body resitance training.    Limitations  Lifting;House hold activities    How long can you sit comfortably?  unlimited    How long can you stand comfortably?  unlimited    How long can you walk comfortably?  unlimited    Diagnostic tests  no imaging completed at this time    Patient Stated Goals  Be able to  exercise without pain/ [employer]an race first week in April    Pain Onset  1 to 4 weeks ago       Manual -STM to pec insertion with patient supine on 1/2 foam roller with noted tension release -AP mobilization grade III in end range ER 30sec bouts 8 bouts and in end range abd for 30sec bouts for 8 bouts  Ther-Ex  -HEP review  -Prone shoulder lift + abduction 3 PT cuing for scapular positioning -Supine serratus punch 3# 1x 10/ 4# 2x 15 (trail with 2# and ## wt with not enough resistance  patient -Push Up plus 3x 14/15/14 with cuing for scapular rhythm  -Empty can shoulder BW 1x 15 ABD w/ 1# wt  2x 15 (BW was not challenging enough for patient -Standing 90/90 ER w/ red tband 3x 15 with cuing for elbow positioning -High Row w/ red tband 3x 12 with cuing for posture -TRX pull ups 1x 12 neutral grip and 1x 12 in supination with bicep activation                        PT Education - 06/12/17 1621    Education provided  Yes    Education Details  Exercise form  Person(s) Educated  Patient    Methods  Explanation;Verbal cues;Tactile cues    Comprehension  Returned demonstration;Tactile cues required;Verbal cues required       PT Short Term Goals - 06/11/17 0954      PT SHORT TERM GOAL #1   Title  Pt will be independent with HEP in order to improve strength and balance in order to decrease fall risk and improve function at home and work.    Time  2    Period  Weeks    Status  New        PT Long Term Goals - 06/11/17 0941      PT LONG TERM GOAL #1   Title  Pt will decrease quick DASH score by at least 8% in order to demonstrate clinically significant reduction in disability.    Baseline  to be taken at next visit    Time  8    Period  Weeks      PT LONG TERM GOAL #2   Title  Patient will demonstrate symmetrical, pain free gross 4+/5 shoulder/scapular strength in order to safely be able [place]    Baseline  3/5: See eval    Time  5     Period  Weeks      PT LONG TERM GOAL #3   Title  Patient will increase [employer] time by 23.5sec to demonstrate clinically significant improvement in back extensor strength (McGill 1999)    Baseline  3/5 31 sec    Time  5    Period  Weeks    Status  New      PT LONG TERM GOAL #4   Title  Pt will decrease worst pain as reported on NPRS by at least 3 points in order to demonstrate clinically significant reduction in pain.    Baseline  3/5 Worst: 4/10    Time  5    Period  Weeks    Status  New      PT LONG TERM GOAL #5   Title  Patient will increase FOTO score to 81 in order to demonstrate predicted increase in functional ability     Baseline  3/5: 65    Time  8    Period  Weeks    Status  New            Plan - 06/12/17 1628    Clinical Impression Statement  Patient tolerated therex for smaller SITS muscles well with noted fatigue after all reps. Patient reported increased difficulty with exercises not using global movers, but reported no pain. Patient required mod TC and VC to activate smaller RTC muscles without global musculature compensation. PT advised pt on cryotherapy use following session for muscle soreness and to avoid overuse with UE resistance training and cardiokickboxing class following PT session.     Rehab Potential  Good    Clinical Impairments Affecting Rehab Potential  (+) young age, active lifestyle, lack of other comorbidities, motivation (-) asthma, excessive exercise (possibly psycho-social)    PT Frequency  2x / week    PT Duration  8 weeks    PT Treatment/Interventions  Cryotherapy;Electrical Stimulation;Moist Heat;Therapeutic exercise;Neuromuscular re-education;Taping;Manual techniques;Dry needling    PT Next Visit Plan  HEP review; lat pull down exercise    PT Home Exercise Plan  YTI w/ 2.5#, red tband IR/ER     Consulted and Agree with Plan of Care  Patient  Patient will benefit from skilled therapeutic intervention in order to improve  the following deficits and impairments:  Decreased endurance, Decreased strength, Impaired UE functional use  Visit Diagnosis: Acute pain of right shoulder     Problem List Patient Active Problem List   Diagnosis Date Noted  . Sepsis (HCC) 01/15/2016   Staci Acosta PT, DPT Staci Acosta 06/12/2017, 5:14 PM  [place] [facility] PHYSICAL AND SPORTS MEDICINE [address], [place], [address] Phone: [phone]   Fax:  [phone]  Name: Robin Riddle MRN: [id number] Date of Birth: September 12, 1995

## 2017-06-18 ENCOUNTER — Encounter: Payer: Self-pay | Admitting: Physical Therapy

## 2017-06-18 ENCOUNTER — Ambulatory Visit: Payer: Managed Care, Other (non HMO) | Admitting: Physical Therapy

## 2017-06-18 DIAGNOSIS — M25511 Pain in right shoulder: Secondary | ICD-10-CM | POA: Diagnosis not present

## 2017-06-18 NOTE — Therapy (Signed)
[facility] PH[facility] MEDICINE [address] [place], [place], [address] Phone: [phone]   Fax:  [phone]  Physical Therapy Treatment  Patient Details  Name: Robin Riddle MRN: [id number] Date of Birth: 10-28-1995 Referring Provider: Janeece AgeeVicki Wood, PA   Encounter Date: 06/18/2017  PT End of Session - 06/18/17 0910    Visit Number  3    Number of Visits  17    Date for PT Re-Evaluation  08/06/17    PT Start Time  0902    PT Stop Time  0953    PT Time Calculation (min)  51 min    Activity Tolerance  Patient tolerated treatment well    Behavior During Therapy  [facility] for tasks assessed/performed       Past Medical History:  Diagnosis Date  . Asthma   . Hypercholesterolemia     Past Surgical History:  Procedure Laterality Date  . WRIST SURGERY      There were no vitals filed for this visit.  Subjective Assessment - 06/18/17 0904    Subjective  Patient reports 0/10 pain today and worst pain over the past week as 3/10. She reports she has changed her workout schedule to alternate UE and LE days, and is continuing cardio kickboxing 3x/week, and running 3x/week.     Pertinent History  Patient is a 22 year old female with hx of R shoulder pain over the past month, and reports she has "always" been unable to do bend over fly exercise d/t ant shoulder pain that radiates down the lateral aspect of the R arm. Patient reports pain with "pushing" motion and rotating motion of the shoulder. Worst pain in the past week 4/10 and best 0/10. No pertinent medical hx. Patient reports she is preparing for a [employer] race and is running 4x/week, cardio kickboxing 3-4x/week, and does 3-4 days of full body resitance training.    Limitations  Lifting;House hold activities    How long can you sit comfortably?  unlimited    How long can you stand comfortably?  unlimited    How long can you walk comfortably?  unlimited    Diagnostic tests  no imaging  completed at this time    Patient Stated Goals  Be able to exercise without pain/ [employer] race first week in April    Pain Onset  1 to 4 weeks ago          Ther-Ex -Prone shoulder lift + abduction # 3x 10 PT cuing for scapular positioning -Push Up plus 3x 15 w/ 7.5 ankle wt across shoulder blades with cuing for core sustained contraction -Empty can shoulder 2# wt  1x 12 3# wt 2x 15 w/ cuing to prevent shoulder hiking -Eccentric push up (cuing for 4sec lowering 1 sec raise) 2x 10 -Standing 90/90 ER w/ red tband 3x 12 with cuing for elbow positioning -TRX pull ups 3x 12 neutral grip with cuing for 4sec eccentric lowering with sustained tension -Squat thruster with overhead press from elbow 90/90 position with cuing for proper hip and scapular alignment rhythm   ice pack at the end of session (9min unbilled)                   PT Education - 06/18/17 0910    Education provided  Yes    Education Details  Exercise form    Person(s) Educated  Patient    Methods  Explanation;Tactile cues;Verbal cues    Comprehension  Verbalized understanding;Tactile  cues required;Verbal cues required;Returned demonstration       PT Short Term Goals - 06/11/17 0954      PT SHORT TERM GOAL #1   Title  Pt will be independent with HEP in order to improve strength and balance in order to decrease fall risk and improve function at home and work.    Time  2    Period  Weeks    Status  New        PT Long Term Goals - 06/11/17 0941      PT LONG TERM GOAL #1   Title  Pt will decrease quick DASH score by at least 8% in order to demonstrate clinically significant reduction in disability.    Baseline  to be taken at next visit    Time  8    Period  Weeks      PT LONG TERM GOAL #2   Title  Patient will demonstrate symmetrical, pain free gross 4+/5 shoulder/scapular strength in order to safely be able to climb rope at [place] race event    Baseline  3/5: See eval    Time  5    Period   Weeks      PT LONG TERM GOAL #3   Title  Patient will increase [employer] time by 23.5sec to demonstrate clinically significant improvement in back extensor strength (McGill 1999)    Baseline  3/5 31 sec    Time  5    Period  Weeks    Status  New      PT LONG TERM GOAL #4   Title  Pt will decrease worst pain as reported on NPRS by at least 3 points in order to demonstrate clinically significant reduction in pain.    Baseline  3/5 Worst: 4/10    Time  5    Period  Weeks    Status  New      PT LONG TERM GOAL #5   Title  Patient will increase FOTO score to 81 in order to demonstrate predicted increase in functional ability     Baseline  3/5: 65    Time  8    Period  Weeks    Status  New            Plan - 06/18/17 0943    Clinical Impression Statement  Patient tolerated all therex progression well and was able to activate proper scapular stabilizers and rotator cuff muscles during more global/advanced exercise and maintain proper humeral-scapular rhythm . Patient is reporting less pain following changing exercise routine to more alternating UE/LE days and decreasing UE load through decreasing cardio-kickboxing days w/ resistance exercise. Patient is reporting increased fatigue with eccentric focused exercise, but no increased pain. Patient reports 0/10 pain following cryotherapy.    Clinical Impairments Affecting Rehab Potential  (+) young age, active lifestyle, lack of other comorbidities, motivation (-) asthma, excessive exercise (possibly psycho-social)    PT Frequency  2x / week    PT Duration  8 weeks    PT Treatment/Interventions  Cryotherapy;Electrical Stimulation;Moist Heat;Therapeutic exercise;Neuromuscular re-education;Taping;Manual techniques;Dry needling    PT Next Visit Plan  HEP review; lat pull down exercise    PT Home Exercise Plan  YTI w/ 2.5#, red tband IR/ER     Consulted and Agree with Plan of Care  Patient       Patient will benefit from skilled  therapeutic intervention in order to improve the following deficits and impairments:  Decreased endurance, Decreased strength,  Impaired UE functional use  Visit Diagnosis: Acute pain of right shoulder     Problem List Patient Active Problem List   Diagnosis Date Noted  . Sepsis (HCC) 01/15/2016   Staci Acosta PT, DPT Staci Acosta 06/18/2017, 9:50 AM  [facility] PHYSICAL AND SPORTS MEDICINE [address], [place], [address] Phone: [phone]   Fax:  [phone]  Name: Celestial Barnfield MRN: [id number] Date of Birth: Feb 05, 1996

## 2017-06-24 ENCOUNTER — Ambulatory Visit: Payer: Managed Care, Other (non HMO) | Admitting: Physical Therapy

## 2017-06-24 ENCOUNTER — Encounter: Payer: Self-pay | Admitting: Physical Therapy

## 2017-06-24 DIAGNOSIS — M25511 Pain in right shoulder: Secondary | ICD-10-CM | POA: Diagnosis not present

## 2017-06-24 NOTE — Therapy (Signed)
[facility] PHYSICAL [facility] SPORTS MEDICINE [address] [place], [address] Phone: [phone]   Fax:  [phone]  Physical Therapy Treatment  Patient Details  Name: Robin Riddle MRN: [id number] Date of Birth: 10/02/1995 Referring Provider: Janeece Agee, PA   Encounter Date: 06/24/2017    Past Medical History:  Diagnosis Date  . Asthma   . Hypercholesterolemia     Past Surgical History:  Procedure Laterality Date  . WRIST SURGERY      There were no vitals filed for this visit.    Ther-Ex  -Prone shoulder lift + abduction # 3x 12 PT cuing for scapular positioning -Push Up plus with narrow hand position 1x 12 2x 15 -Empty can shoulder 3# wt  1x 12 w/ cuing to prevent shoulder hiking -Supine pendant 90/90 ER w/ 3# 3x 12 with cuing for elbow positioning and eccentric control -Decline pushups from 44ft mat table 2x 10 -High Row 20# 3x 12 with cuing for eccentric control and scapular rhythm  -Plank row from knees (patient was unable to complete with correct form full plank) w/ 4# 2x 12 with cuing for scapular movement, eccentric control and core contraction -Bear crawl 63ft x4 with patient improving form and scapular/core posture/alignment each trial with TC and VC   Ice pack 9 mins at end of session (unbilled)                       PT Short Term Goals - 06/11/17 0954      PT SHORT TERM GOAL #1   Title  Pt will be independent with HEP in order to improve strength and balance in order to decrease fall risk and improve function at home and work.    Time  2    Period  Weeks    Status  New        PT Long Term Goals - 06/11/17 0941      PT LONG TERM GOAL #1   Title  Pt will decrease quick DASH score by at least 8% in order to demonstrate clinically significant reduction in disability.    Baseline  to be taken at next visit    Time  8    Period  Weeks      PT LONG TERM GOAL #2   Title  Patient will  demonstrate symmetrical, pain free gross 4+/5 shoulder/scapular strength in order to safely be able to climb rope at [place] race event    Baseline  3/5: See eval    Time  5    Period  Weeks      PT LONG TERM GOAL #3   Title  Patient will increase [employer] time by 23.5sec to demonstrate clinically significant improvement in back extensor strength (McGill 1999)    Baseline  3/5 31 sec    Time  5    Period  Weeks    Status  New      PT LONG TERM GOAL #4   Title  Pt will decrease worst pain as reported on NPRS by at least 3 points in order to demonstrate clinically significant reduction in pain.    Baseline  3/5 Worst: 4/10    Time  5    Period  Weeks    Status  New      PT LONG TERM GOAL #5   Title  Patient will increase FOTO score to 81 in order to demonstrate predicted increase in functional ability  Baseline  3/5: 65    Time  8    Period  Weeks    Status  New              Patient will benefit from skilled therapeutic intervention in order to improve the following deficits and impairments:     Visit Diagnosis: No diagnosis found.     Problem List Patient Active Problem List   Diagnosis Date Noted  . Sepsis (HCC) 01/15/2016    Staci Acostahelsea Miller 06/24/2017, 2:33 PM  [facility] [address] , [place], [address] Phone: [phone]   Fax:  [phone]  Name: Robin Riddle MRN: [id number] Date of Birth: 03-Apr-1996

## 2017-06-26 ENCOUNTER — Encounter: Payer: Self-pay | Admitting: Physical Therapy

## 2017-06-26 ENCOUNTER — Ambulatory Visit: Payer: Managed Care, Other (non HMO) | Admitting: Physical Therapy

## 2017-06-26 DIAGNOSIS — M25511 Pain in right shoulder: Secondary | ICD-10-CM | POA: Diagnosis not present

## 2017-06-26 NOTE — Therapy (Signed)
[facility] PHYSICAL [facility]ORTS MEDICINE [address] [place], [place], [address] Phone: [phone]   Fax:  [phone]  Physical Therapy Treatment  Patient Details  Name: Robin Riddle MRN: [id number] Date of Birth: 10-25-1995 Referring Provider: Janeece AgeeVicki Wood, PA   Encounter Date: 06/26/2017  PT End of Session - 06/26/17 1448    Visit Number  5    Number of Visits  17    Date for PT Re-Evaluation  08/06/17    PT Start Time  0241    PT Stop Time  0315    PT Time Calculation (min)  34 min    Activity Tolerance  Patient tolerated treatment well    Behavior During Therapy  [facility] for tasks assessed/performed       Past Medical History:  Diagnosis Date  . Asthma   . Hypercholesterolemia     Past Surgical History:  Procedure Laterality Date  . WRIST SURGERY      There were no vitals filed for this visit.  Subjective Assessment - 06/26/17 1445    Subjective  Patient reports no pain in the shoulder today, but reports some fatigue/soreness. Patient reports good compliance with HEP and workout regimen. No questions or concerns at this time.     Pertinent History  Patient is a 22 year old female with hx of R shoulder pain over the past month, and reports she has "always" been unable to do bend over fly exercise d/t ant shoulder pain that radiates down the lateral aspect of the R arm. Patient reports pain with "pushing" motion and rotating motion of the shoulder. Worst pain in the past week 4/10 and best 0/10. No pertinent medical hx. Patient reports she is preparing for a [employer] race and is running 4x/week, cardio kickboxing 3-4x/week, and does 3-4 days of full body resitance training.    Limitations  Lifting;House hold activities    How long can you sit comfortably?  unlimited    How long can you stand comfortably?  unlimited    How long can you walk comfortably?  unlimited    Diagnostic tests  no imaging completed at this time    Patient Stated  Goals  Be able to exercise without pain/ [employer] race first week in April    Pain Onset  1 to 4 weeks ago       Ther-Ex  -Prone shoulder lift +abduction# 3x 12PT cuing for scapular positioning -Push Up plus with narrow hand position3x12  -Empty can shoulder3# wt3x 12w/ cuing to prevent shoulder hiking - Reverse inch worm 3x 5 walkouts -Bosu ball push ups 3x 8 (all patient is able to complete at time with correct form) with cuing for sustained core contraction and correct scapular movement -Quadraped serratus reach on yellow theraball 1x 10 and in push up position 2x 8 -Keetle bell arm bar 2x 8+ screwdriver variation 2x 8 +  hip rotation variation 2x 8                      PT Education - 06/26/17 1448    Education provided  Yes    Education Details  Exercise form    Person(s) Educated  Patient    Methods  Explanation;Demonstration;Tactile cues;Verbal cues    Comprehension  Verbal cues required;Tactile cues required;Verbalized understanding;Returned demonstration       PT Short Term Goals - 06/11/17 0954      PT SHORT TERM GOAL #1   Title  Pt  will be independent with HEP in order to improve strength and balance in order to decrease fall risk and improve function at home and work.    Time  2    Period  Weeks    Status  New        PT Long Term Goals - 06/11/17 0941      PT LONG TERM GOAL #1   Title  Pt will decrease quick DASH score by at least 8% in order to demonstrate clinically significant reduction in disability.    Baseline  to be taken at next visit    Time  8    Period  Weeks      PT LONG TERM GOAL #2   Title  Patient will demonstrate symmetrical, pain free gross 4+/5 shoulder/scapular strength in order to safely be able to climb rope at [place] race event    Baseline  3/5: See eval    Time  5    Period  Weeks      PT LONG TERM GOAL #3   Title  Patient will increase [employer] time by 23.5sec to demonstrate clinically significant  improvement in back extensor strength (McGill 1999)    Baseline  3/5 31 sec    Time  5    Period  Weeks    Status  New      PT LONG TERM GOAL #4   Title  Pt will decrease worst pain as reported on NPRS by at least 3 points in order to demonstrate clinically significant reduction in pain.    Baseline  3/5 Worst: 4/10    Time  5    Period  Weeks    Status  New      PT LONG TERM GOAL #5   Title  Patient will increase FOTO score to 81 in order to demonstrate predicted increase in functional ability     Baseline  3/5: 65    Time  8    Period  Weeks    Status  New            Plan - 06/26/17 1555    Clinical Impression Statement  Patient tolerated high level shoulder stability well without pain. Pain is becoming less of an issue for patient and patient is more concerned about ability to complete [employer]an race without pain during/following. Patient is demonstrating better ability to self correct shoulder posture through stabilization exercise. Patient will continue HEP next week as she will be on vacation for Spring Break, and will come back to PT following 2x for 1 week before race, and 1x a week for weeks following.     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 Sepsis (HCC) 01/15/2016    Chelsea  Miller 06/26/2017, 4:00 PM  [facility] PHYSICAL AND SPORTS MEDICINE [address] [place], [address] Phone: [phone]   Fax:  [phone]  Name: Robin Riddle MRN: [id number] Date of Birth: 1995/09/05

## 2017-07-03 ENCOUNTER — Ambulatory Visit: Payer: Managed Care, Other (non HMO) | Admitting: Physical Therapy

## 2017-07-08 ENCOUNTER — Encounter: Payer: Self-pay | Admitting: Physical Therapy

## 2017-07-08 ENCOUNTER — Ambulatory Visit: Payer: Managed Care, Other (non HMO) | Attending: Family | Admitting: Physical Therapy

## 2017-07-08 DIAGNOSIS — M25511 Pain in right shoulder: Secondary | ICD-10-CM | POA: Insufficient documentation

## 2017-07-08 NOTE — Therapy (Signed)
[name] PHYS[facility] AND [facility]OR[facility] MEDICINE [address] [place], [address] Phone: [phone]   Fax:  [phone]  Physical Therapy Treatment  Patient Details  Name: Robin Riddle MRN: [id number] Date of Birth: 01-Dec-1995 Referring Provider: Janeece Agee, PA   Encounter Date: 07/08/2017  PT End of Session - 07/08/17 1610    Visit Number  6    Number of Visits  17    Date for PT Re-Evaluation  08/06/17    PT Start Time  0400    PT Stop Time  0445    PT Time Calculation (min)  45 min    Activity Tolerance  Patient tolerated treatment well    Behavior During Therapy  [facility] for tasks assessed/performed       Past Medical History:  Diagnosis Date  . Asthma   . Hypercholesterolemia     Past Surgical History:  Procedure Laterality Date  . WRIST SURGERY      There were no vitals filed for this visit.   Subjective Assessment - 07/08/17 1604    Subjective  Patient reports no pain in the shoulder today, but reports it has been up to a 4/10 after not being able to do as many exercises on spring break.. Patient reports she felt some pain pushing herself up off the floor and quick movements where she is pushing her bodyweight. Patient is participating in [place] race this Saturday.     Pertinent History  Patient is a 22 year old female with hx of R shoulder pain over the past month, and reports she has "always" been unable to do bend over fly exercise d/t ant shoulder pain that radiates down the lateral aspect of the R arm. Patient reports pain with "pushing" motion and rotating motion of the shoulder. Worst pain in the past week 4/10 and best 0/10. No pertinent medical hx. Patient reports she is preparing for a [employer] race and is running 4x/week, cardio kickboxing 3-4x/week, and does 3-4 days of full body resitance training.    Limitations  Lifting;House hold activities    How long can you sit comfortably?  unlimited    How long can you stand  comfortably?  unlimited    How long can you walk comfortably?  unlimited    Diagnostic tests  no imaging completed at this time    Patient Stated Goals  Be able to exercise without pain/ [employer] race first week in April    Pain Onset  1 to 4 weeks ago         Ther-Ex  -Prone shoulder lift + abduction # 3x 12 PT cuing for scapular positioning -Push Up plus with narrow hand position 3x 12  -Empty can shoulder 3# wt  3x 12 w/ cuing to prevent shoulder hiking/proper amount of scaption  -Over head farmers carry 20# kettle bell 9ft 3x  W/  cuing to prevent L lateral lean compensation -Keetle bell arm bar 3x 10 w/ screwdriver variation 2x 8 +  hip rotation variation 3x 10 -Squat + overhead press 3x 12 -Seated Lat pull down 55# with cuing to maintain scapular position without shoulder hiking compensation -Pull up TRX band 3x 9/7/8 as close to parallel to the floor as patient was able     Ice pack at the end of session unbilled        Objective measurements completed on examination: See above findings.              PT  Education - 07/08/17 1610    Education provided  Yes    Education Details  Exercise form    Person(s) Educated  Patient    Methods  Explanation;Demonstration;Tactile cues;Verbal cues    Comprehension  Verbalized understanding;Returned demonstration;Verbal cues required;Tactile cues required       PT Short Term Goals - 06/11/17 0954      PT SHORT TERM GOAL #1   Title  Pt will be independent with HEP in order to improve strength and balance in order to decrease fall risk and improve function at home and work.    Time  2    Period  Weeks    Status  New        PT Long Term Goals - 06/11/17 0941      PT LONG TERM GOAL #1   Title  Pt will decrease quick DASH score by at least 8% in order to demonstrate clinically significant reduction in disability.    Baseline  to be taken at next visit    Time  8    Period  Weeks      PT LONG TERM GOAL #2    Title  Patient will demonstrate symmetrical, pain free gross 4+/5 shoulder/scapular strength in order to safely be able [place]    Baseline  3/5: See eval    Time  5    Period  Weeks      PT LONG TERM GOAL #3   Title  Patient will increase [employer] time by 23.5sec to demonstrate clinically significant improvement in back extensor strength (McGill 1999)    Baseline  3/5 31 sec    Time  5    Period  Weeks    Status  New      PT LONG TERM GOAL #4   Title  Pt will decrease worst pain as reported on NPRS by at least 3 points in order to demonstrate clinically significant reduction in pain.    Baseline  3/5 Worst: 4/10    Time  5    Period  Weeks    Status  New      PT LONG TERM GOAL #5   Title  Patient will increase FOTO score to 81 in order to demonstrate predicted increase in functional ability     Baseline  3/5: 65    Time  8    Period  Weeks    Status  New             Plan - 07/08/17 1627    Clinical Impression Statement  Patient tolerated therex progression well with no pain. PT concludes that paitient is having pain with un-anticipated movements but is able to correct scapular alignment to properly prepare for exercise movements causing no pain. With this being said, patient is encouraged to utilize proper motor control and focus on form at her race this weekend to prevent further pain.     Rehab Potential  Good    Clinical Impairments Affecting Rehab Potential  (+) young age, active lifestyle, lack of other comorbidities, motivation (-) asthma, excessive exercise (possibly psycho-social)    PT Frequency  2x / week    PT Duration  8 weeks    PT Treatment/Interventions  Cryotherapy;Electrical Stimulation;Moist Heat;Therapeutic exercise;Neuromuscular re-education;Taping;Manual techniques;Dry needling    PT Next Visit Plan  Follow up post race    Consulted and Agree with Plan of Care  Patient       Patient will  benefit from skilled  therapeutic intervention in order to improve the following deficits and impairments:  Decreased endurance, Decreased strength, Impaired UE functional use  Visit Diagnosis: Acute pain of right shoulder     Problem List Patient Active Problem List   Diagnosis Date Noted  . Sepsis ([facility]) 01/15/2016   Staci Acosta PT, DPT Staci Acosta 07/08/2017, 4:41 PM  [facility] PHYSICAL AND SPORTS MEDICINE [address] [place], [address] Phone: [phone]   Fax:  [phone]  Name: Robin Riddle MRN: [id number] Date of Birth: 1995/10/15

## 2017-07-10 ENCOUNTER — Ambulatory Visit: Payer: Managed Care, Other (non HMO) | Admitting: Physical Therapy

## 2017-07-15 ENCOUNTER — Ambulatory Visit: Payer: Managed Care, Other (non HMO) | Admitting: Physical Therapy

## 2017-07-15 ENCOUNTER — Encounter: Payer: Self-pay | Admitting: Physical Therapy

## 2017-07-15 DIAGNOSIS — M25511 Pain in right shoulder: Secondary | ICD-10-CM | POA: Diagnosis not present

## 2017-07-15 NOTE — Therapy (Signed)
[place] [facility]OR[facility] [address], [place], [address] Phone: [phone]   Fax:  [phone]  Physical Therapy Treatment/Discharge  Patient Details  Name: Naveena Eyman MRN: [id number] Date of Birth: 11-28-1995 Referring Provider: Wardell Honour, PA   Encounter Date: 07/15/2017  PT End of Session - 07/15/17 1704    Visit Number  7    Number of Visits  17    Date for PT Re-Evaluation  08/06/17    PT Start Time  0215    PT Stop Time  0238    PT Time Calculation (min)  23 min    Activity Tolerance  Patient tolerated treatment well    Behavior During Therapy  [facility] for tasks assessed/performed       Past Medical History:  Diagnosis Date  . Asthma   . Hypercholesterolemia     Past Surgical History:  Procedure Laterality Date  . WRIST SURGERY      There were no vitals filed for this visit.  Subjective Assessment - 07/15/17 1421    Subjective  Patient reports that she has had no pain in the shoulder through her [employer] race. Patient reports she did pull her L hamstring pulling herself over a wall with her leg during her race. Patient reports that she feels as though she has met all goals after being able to successfully complete [employer] race with no pain and proper form for the rope challenges. Patient reports she is extremely sore today following the race this weekend.     Pertinent History  Patient is a 22 year old female with hx of R shoulder pain over the past month, and reports she has "always" been unable to do bend over fly exercise d/t ant shoulder pain that radiates down the lateral aspect of the R arm. Patient reports pain with "pushing" motion and rotating motion of the shoulder. Worst pain in the past week 4/10 and best 0/10. No pertinent medical hx. Patient reports she is preparing for a [employer] race and is running 4x/week, cardio kickboxing 3-4x/week, and does 3-4 days of full body resitance training.    Limitations  Lifting;House hold activities    How long can you sit comfortably?  unlimited    How long can you stand comfortably?  unlimited    How long can you walk comfortably?  unlimited    Diagnostic tests  no imaging completed at this time    Patient Stated Goals  Be able to exercise without pain/ [employer] race first week in April    Pain Onset  1 to 4 weeks ago         Ther-Ex -[employer] test 2 trials 45sec/46.3sec -MMT on each UE patient was able to demonstrate 5/5 strength in bilat flex/abd/IR/ER/ext -HEP review of print out with resistance exercises targetted at RTC muscles -Education on hamstring strain protocol ice/rest/seated hamstring stretch demonstration and education for mild stretch NO PAIN and to call clinic if she is unable to resolve pain on her own for new referral (pt reports she has had a muscle strain before and blieves she can take care of it on her own) -Goal review -Education on resistance training schedule 3-5 sets 8-12 reps training muscle groups 1-2x/week to prevent over training injury                    PT Education - 07/15/17 1658    Education provided  Yes    Education Details  Discharge recommendations    Person(s) Educated  Patient    Methods  Verbal cues;Handout    Comprehension  Verbal cues required;Verbalized understanding       PT Short Term Goals - 07/15/17 1708      PT SHORT TERM GOAL #1   Title  Pt will be independent with HEP in order to improve strength and balance in order to decrease fall risk and improve function at home and work.    Status  Achieved        PT Long Term Goals - 07/15/17 1427      PT LONG TERM GOAL #1   Title  Pt will decrease quick DASH score by at least 8% in order to demonstrate clinically significant reduction in disability.    Baseline  0% (was 13.6% at eval)    Status  Achieved      PT LONG TERM GOAL #2   Title  Patient will demonstrate symmetrical, pain free gross 4+/5  shoulder/scapular strength in order to safely be able to climb rope at [place] race event    Baseline  4/8 5/5 bilat gross shoulder strength    Status  Achieved      PT LONG TERM GOAL #3   Title  Patient will increase [employer] time by 23.5sec to demonstrate clinically significant improvement in back extensor strength (McGill 1999)    Baseline  4/8 46.3sec    Status  Achieved      PT LONG TERM GOAL #4   Title  Pt will decrease worst pain as reported on NPRS by at least 3 points in order to demonstrate clinically significant reduction in pain.    Baseline  4/8 0/10 pain over the past week    Status  Achieved      PT LONG TERM GOAL #5   Title  Patient will increase FOTO score to 81 in order to demonstrate predicted increase in functional ability     Baseline  4/8 87    Status  Achieved            Plan - 07/15/17 1714    Clinical Impression Statement  Patient has met all goals at this time, and comopleted the [employer] race without pain that she has been preparing for in PT. Patient reports she feels as though she can safely be d/c and continue resistance program maintaining motor control gains she has made and education on resistance training programming to prevent over training. PT is advised to use cryotherapy, mild stretching, and rest for hamstring strain and to call clinic back should she need a referral for this.     Clinical Impairments Affecting Rehab Potential  (+) young age, active lifestyle, lack of other comorbidities, motivation (-) asthma, excessive exercise (possibly psycho-social)    PT Treatment/Interventions  Cryotherapy;Electrical Stimulation;Moist Heat;Therapeutic exercise;Neuromuscular re-education;Taping;Manual techniques;Dry needling    PT Next Visit Plan  Follow up post race    PT Home Exercise Plan  YTI w/ 2.5#, red tband IR/ER     Consulted and Agree with Plan of Care  Patient       Patient will benefit from skilled therapeutic intervention in order to  improve the following deficits and impairments:  Decreased endurance, Decreased strength, Impaired UE functional use  Visit Diagnosis: Acute pain of right shoulder     Problem List Patient Active Problem List   Diagnosis Date Noted  . Sepsis ([place]) 01/15/2016   Shelton Silvas PT, DPT Shelton Silvas 07/15/2017, 5:46 PM  [facility]  [place] [facility] SPORTS MEDICINE [address] [place], [address] Phone: [phone]   Fax:  [phone]  Name: Jabrea Kallstrom MRN: [id number] Date of Birth: [birth year]

## 2017-07-17 ENCOUNTER — Encounter: Payer: Managed Care, Other (non HMO) | Admitting: Physical Therapy

## 2017-07-22 ENCOUNTER — Encounter: Payer: Managed Care, Other (non HMO) | Admitting: Physical Therapy

## 2017-07-24 ENCOUNTER — Encounter: Payer: Managed Care, Other (non HMO) | Admitting: Physical Therapy

## 2017-07-29 ENCOUNTER — Encounter: Payer: Managed Care, Other (non HMO) | Admitting: Physical Therapy

## 2017-07-31 ENCOUNTER — Encounter: Payer: Managed Care, Other (non HMO) | Admitting: Physical Therapy

## 2017-08-05 ENCOUNTER — Encounter: Payer: Managed Care, Other (non HMO) | Admitting: Physical Therapy

## 2017-08-07 ENCOUNTER — Encounter: Payer: Managed Care, Other (non HMO) | Admitting: Physical Therapy

## 2018-04-05 IMAGING — CR DG CHEST 2V
1 series · 2 of 2 positions shown · non-contrast
Comparison: None.

CLINICAL DATA: Acute onset of fever, sore throat, chills and
diaphoresis. Nasal drip. Headache and cough. Initial encounter.

EXAM:
CHEST  2 VIEW

[Series 1: dg chest 2 view · 0.14mm/px · 2 of 2 slices shown]
[im 1/2]
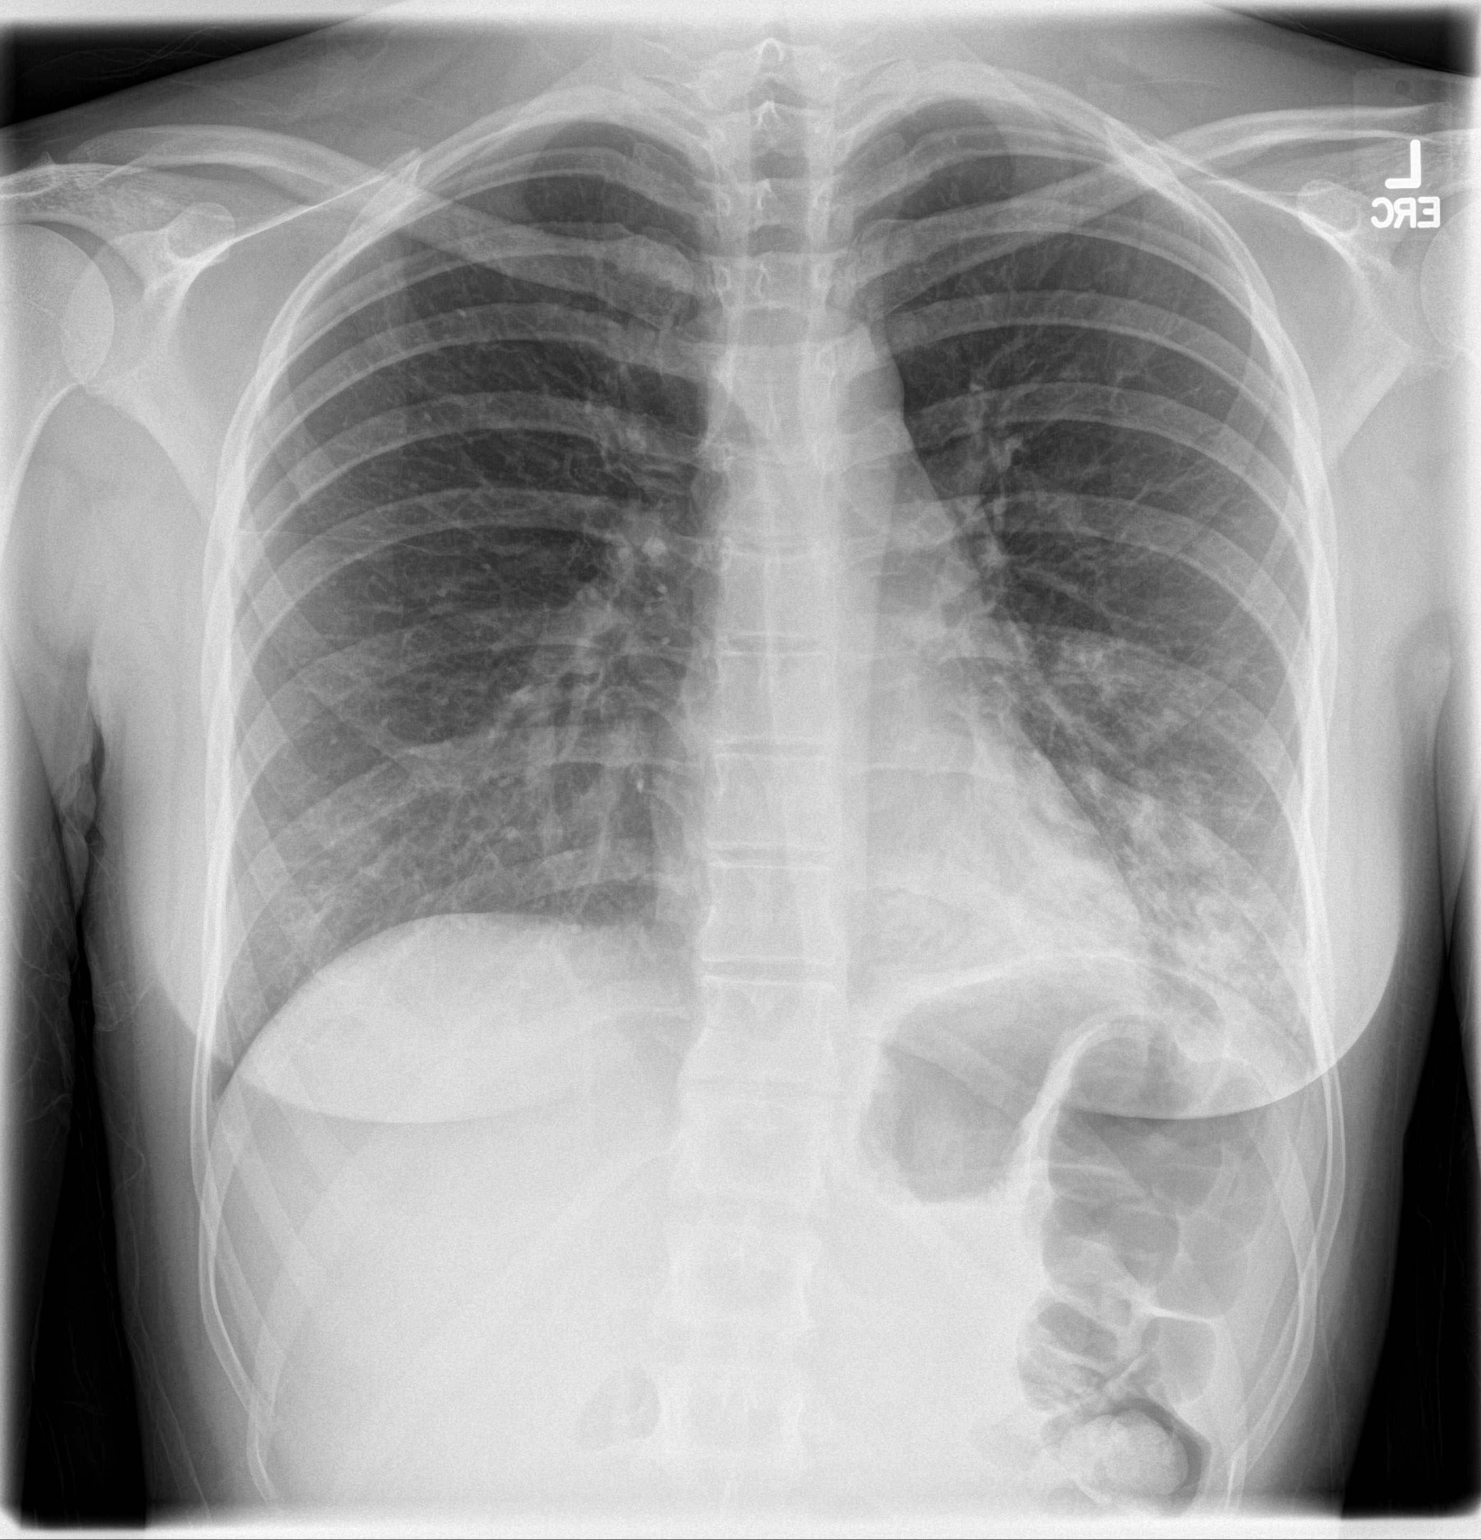
[im 2/2]
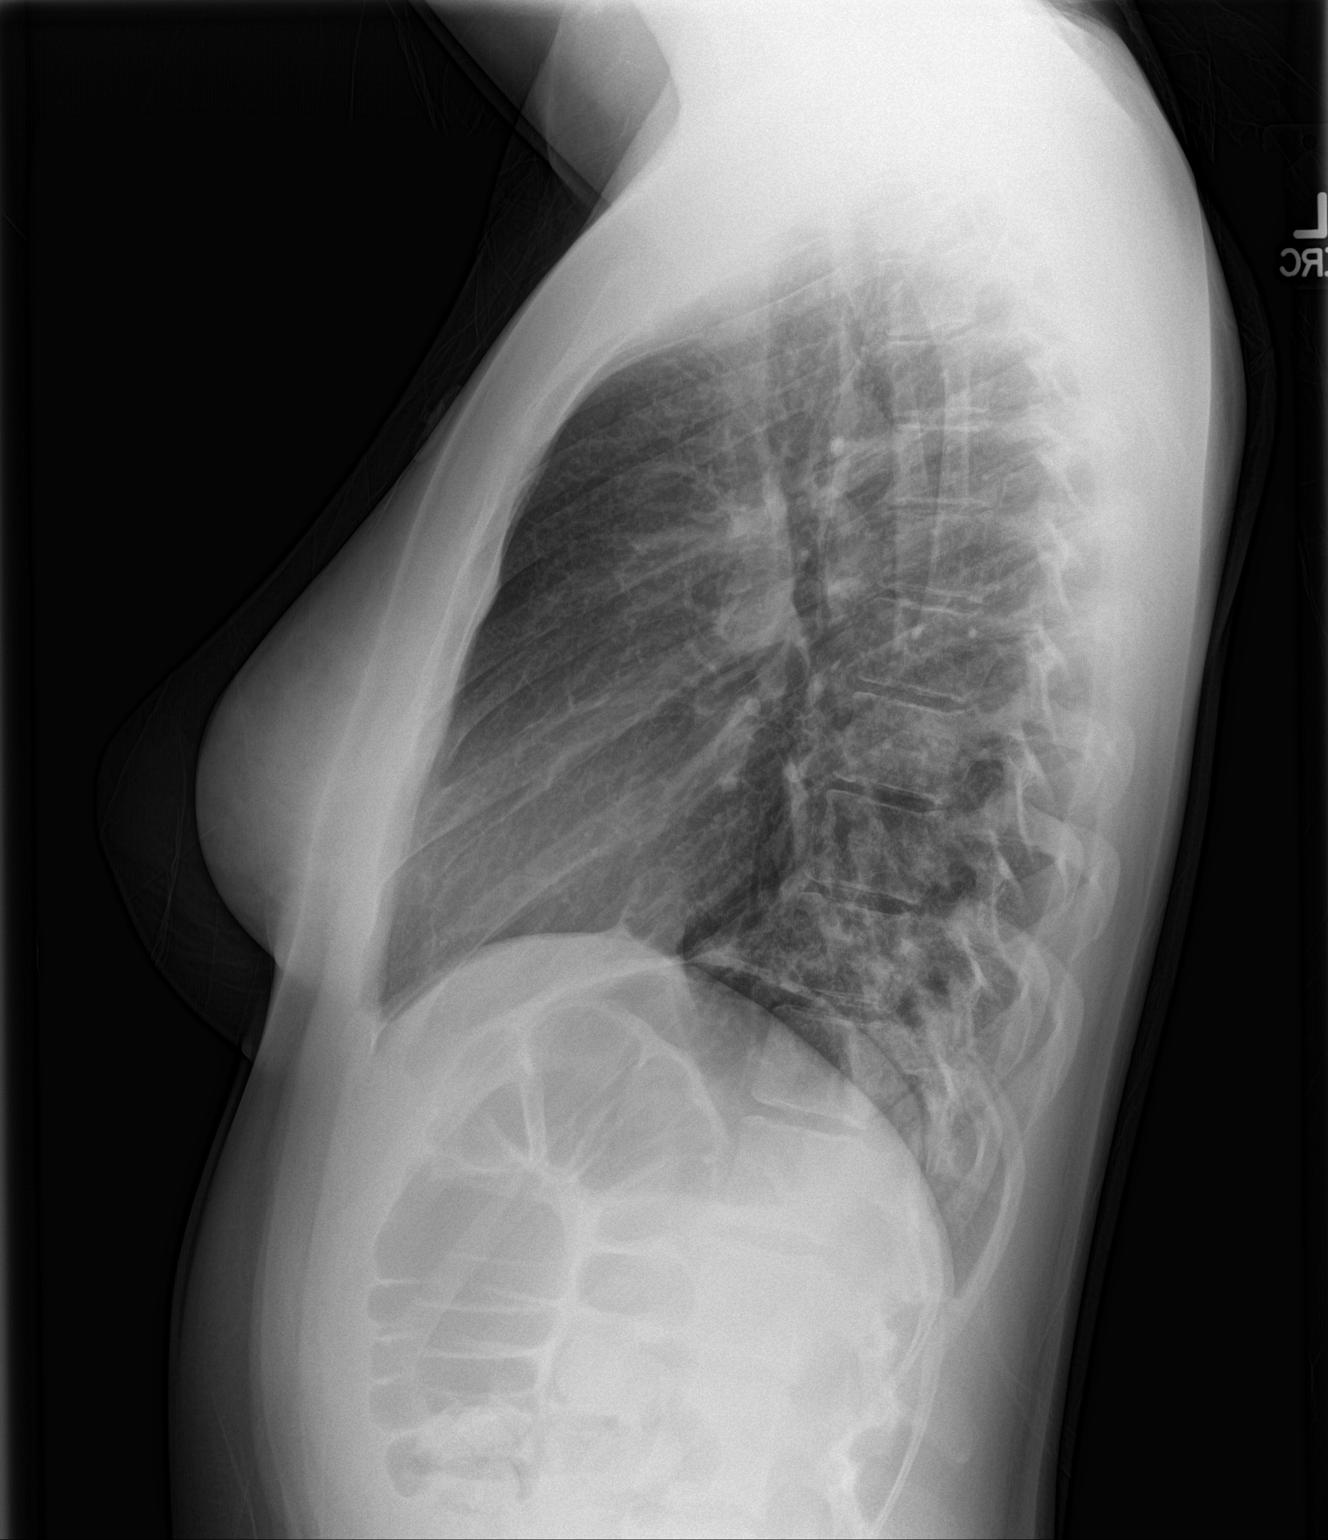

[2 of 2 positions shown; findings below may reference images not displayed]

FINDINGS: The lungs are well-aerated. Left lower lobe airspace opacity is
compatible with pneumonia. There is no evidence of pleural effusion
or pneumothorax.

The heart is normal in size; the mediastinal contour is within
normal limits. No acute osseous abnormalities are seen.
IMPRESSION: Left lower lobe pneumonia noted.
